# Patient Record
Sex: Female | Born: 1970 | Race: White | Hispanic: No | Marital: Married | Smoking: Never smoker
Health system: Southern US, Community
[De-identification: ages and names within clinical notes are randomized; demographics above are authoritative.]

## PROBLEM LIST (undated history)

## (undated) DIAGNOSIS — I10 Essential (primary) hypertension: Secondary | ICD-10-CM

## (undated) DIAGNOSIS — K219 Gastro-esophageal reflux disease without esophagitis: Secondary | ICD-10-CM

## (undated) DIAGNOSIS — N189 Chronic kidney disease, unspecified: Secondary | ICD-10-CM

## (undated) DIAGNOSIS — N200 Calculus of kidney: Secondary | ICD-10-CM

## (undated) HISTORY — PX: TUBAL LIGATION: SHX77

## (undated) HISTORY — PX: AUGMENTATION MAMMAPLASTY: SUR837

## (undated) HISTORY — DX: Essential (primary) hypertension: I10

## (undated) HISTORY — PX: BREAST ENHANCEMENT SURGERY: SHX7

---

## 2006-01-14 HISTORY — PX: COLONOSCOPY: SHX174

## 2006-03-03 ENCOUNTER — Ambulatory Visit: Payer: Self-pay | Admitting: Gastroenterology

## 2006-03-14 ENCOUNTER — Ambulatory Visit: Payer: Self-pay | Admitting: Gastroenterology

## 2006-04-22 ENCOUNTER — Ambulatory Visit: Payer: Self-pay

## 2007-01-15 HISTORY — PX: AUGMENTATION MAMMAPLASTY: SUR837

## 2010-05-24 ENCOUNTER — Ambulatory Visit: Payer: Self-pay | Admitting: Internal Medicine

## 2010-05-25 ENCOUNTER — Ambulatory Visit: Payer: Self-pay | Admitting: Internal Medicine

## 2010-08-07 ENCOUNTER — Ambulatory Visit: Payer: Self-pay | Admitting: Internal Medicine

## 2012-03-16 ENCOUNTER — Ambulatory Visit: Payer: Self-pay | Admitting: Obstetrics and Gynecology

## 2012-11-21 ENCOUNTER — Ambulatory Visit: Payer: Self-pay

## 2012-11-21 LAB — URINALYSIS, COMPLETE
Bilirubin,UR: NEGATIVE
Ketone: NEGATIVE
Nitrite: POSITIVE
Ph: 7.5 (ref 4.5–8.0)
Specific Gravity: 1.01 (ref 1.003–1.030)
WBC UR: >30 /HPF (ref 0–5)

## 2013-03-17 ENCOUNTER — Ambulatory Visit: Payer: Self-pay | Admitting: Obstetrics and Gynecology

## 2013-07-15 ENCOUNTER — Ambulatory Visit: Payer: Self-pay

## 2014-04-25 ENCOUNTER — Ambulatory Visit
Admit: 2014-04-25 | Disposition: A | Discharge status: Home / Self Care | Payer: Self-pay | Attending: Obstetrics and Gynecology | Admitting: Obstetrics and Gynecology

## 2014-04-28 ENCOUNTER — Ambulatory Visit
Admit: 2014-04-28 | Disposition: A | Discharge status: Home / Self Care | Payer: Self-pay | Attending: Obstetrics and Gynecology | Admitting: Obstetrics and Gynecology

## 2014-05-05 ENCOUNTER — Other Ambulatory Visit: Payer: Self-pay | Admitting: Obstetrics and Gynecology

## 2014-05-05 DIAGNOSIS — N6320 Unspecified lump in the left breast, unspecified quadrant: Secondary | ICD-10-CM

## 2014-10-16 ENCOUNTER — Encounter: Payer: Self-pay | Admitting: Internal Medicine

## 2014-10-16 DIAGNOSIS — N281 Cyst of kidney, acquired: Secondary | ICD-10-CM | POA: Insufficient documentation

## 2014-10-17 ENCOUNTER — Encounter: Payer: Self-pay | Admitting: Internal Medicine

## 2014-10-17 ENCOUNTER — Other Ambulatory Visit: Payer: Self-pay | Admitting: Internal Medicine

## 2014-10-17 DIAGNOSIS — G4700 Insomnia, unspecified: Secondary | ICD-10-CM | POA: Insufficient documentation

## 2014-10-17 DIAGNOSIS — J309 Allergic rhinitis, unspecified: Secondary | ICD-10-CM | POA: Insufficient documentation

## 2014-10-17 DIAGNOSIS — K219 Gastro-esophageal reflux disease without esophagitis: Secondary | ICD-10-CM | POA: Insufficient documentation

## 2014-10-17 DIAGNOSIS — K5900 Constipation, unspecified: Secondary | ICD-10-CM | POA: Insufficient documentation

## 2014-10-18 ENCOUNTER — Encounter: Payer: Self-pay | Admitting: Internal Medicine

## 2014-10-18 ENCOUNTER — Ambulatory Visit (INDEPENDENT_AMBULATORY_CARE_PROVIDER_SITE_OTHER): Payer: 59 | Admitting: Internal Medicine

## 2014-10-18 VITALS — BP 120/80 | HR 72 | Ht 61.0 in | Wt 135.6 lb

## 2014-10-18 DIAGNOSIS — M25572 Pain in left ankle and joints of left foot: Secondary | ICD-10-CM | POA: Diagnosis not present

## 2014-10-18 NOTE — Progress Notes (Signed)
Date:  10/18/2014   Name:  Renee Price   DOB:  Jul 05, 1970   MRN:  [id number]   Chief Complaint: Foot Swelling HPI Onset 2 months ago of left ankle discomfort and swelling.  Occasionally gets sharp pain with weight bearing.  More edema at the end of the day.  No history of trauma.  She has taken some tylenol with some relief.  No redness or point tenderness. No numbness or tingling.  Review of Systems:  Review of Systems  Constitutional: Negative for fever.  Respiratory: Negative for shortness of breath and wheezing.   Cardiovascular: Negative for chest pain and palpitations.  Gastrointestinal: Negative for abdominal pain.  Musculoskeletal: Positive for joint swelling and arthralgias. Negative for gait problem.  Hematological: Does not bruise/bleed easily.    Patient Active Problem List   Diagnosis Date Noted  . Acid reflux 10/17/2014  . CN (constipation) 10/17/2014  . Cannot sleep 10/17/2014  . Allergic rhinitis 10/17/2014  . 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 Authorizing Provider  esomeprazole (NEXIUM) 40 MG capsule Take 1 capsule by mouth daily. 01/17/12  Yes Historical Provider, MD    No Known Allergies  Past Surgical History  Procedure Laterality Date  . Tubal ligation    . Breast enhancement surgery    . Colonoscopy  2008    Social History  Substance Use Topics  . Smoking status: Never Smoker   . Smokeless tobacco: None  . Alcohol Use: 1.2 oz/week    2 Standard drinks or equivalent per week     Medication list has been reviewed and updated.  Physical Examination:  Physical Exam  Cardiovascular: Intact distal pulses.   Musculoskeletal:       Left ankle: She exhibits swelling. She exhibits normal range of motion, no ecchymosis, no laceration and normal pulse.       Feet:  Nursing note and vitals reviewed.   BP 120/80 mmHg  Pulse 72  Ht 5' 1"  (1.549 m)  Wt 135 lb 9.6 oz  (61.508 kg)  BMI 25.63 kg/m2  Assessment and Plan: 1. Ankle pain, left Soft tissue mass causing pressure - Ambulatory referral to [place], MD [facility]  10/18/2014

## 2014-10-31 ENCOUNTER — Other Ambulatory Visit: Payer: Self-pay | Admitting: Obstetrics and Gynecology

## 2014-10-31 ENCOUNTER — Ambulatory Visit
Admission: RE | Admit: 2014-10-31 | Discharge: 2014-10-31 | Disposition: A | Discharge status: Home / Self Care | Payer: 59 | Source: Ambulatory Visit | Attending: Obstetrics and Gynecology | Admitting: Obstetrics and Gynecology

## 2014-10-31 DIAGNOSIS — N63 Unspecified lump in breast: Secondary | ICD-10-CM | POA: Insufficient documentation

## 2014-10-31 DIAGNOSIS — N6320 Unspecified lump in the left breast, unspecified quadrant: Secondary | ICD-10-CM

## 2014-10-31 DIAGNOSIS — Z9882 Breast implant status: Secondary | ICD-10-CM | POA: Insufficient documentation

## 2014-11-03 ENCOUNTER — Other Ambulatory Visit: Payer: Self-pay | Admitting: Obstetrics and Gynecology

## 2014-11-03 DIAGNOSIS — N63 Unspecified lump in unspecified breast: Secondary | ICD-10-CM

## 2014-11-10 ENCOUNTER — Other Ambulatory Visit: Payer: Self-pay | Admitting: Specialist

## 2014-11-10 DIAGNOSIS — M67472 Ganglion, left ankle and foot: Secondary | ICD-10-CM

## 2014-11-16 ENCOUNTER — Ambulatory Visit
Admission: RE | Admit: 2014-11-16 | Discharge: 2014-11-16 | Disposition: A | Discharge status: Home / Self Care | Payer: 59 | Source: Ambulatory Visit | Attending: Specialist | Admitting: Specialist

## 2014-11-16 DIAGNOSIS — M67472 Ganglion, left ankle and foot: Secondary | ICD-10-CM | POA: Insufficient documentation

## 2014-11-30 ENCOUNTER — Encounter: Payer: Self-pay | Admitting: *Deleted

## 2014-11-30 ENCOUNTER — Other Ambulatory Visit: Payer: 59

## 2014-11-30 NOTE — Patient Instructions (Signed)
  Your procedure is scheduled on: 12-07-14 Report to [place] To find out your arrival time please call [phone] between 1PM - 3PM on 12-06-14  Remember: Instructions that are not followed completely may result in serious medical risk, up to and including death, or upon the discretion of your surgeon and anesthesiologist your surgery may need to be rescheduled.    _X___ 1. Do not eat food or drink liquids after midnight. No gum chewing or hard candies.     _X___ 2. No Alcohol for 24 hours before or after surgery.   ____ 3. Bring all medications with you on the day of surgery if instructed.    ____ 4. Notify your doctor if there is any change in your medical condition     (cold, fever, infections).     Do not wear jewelry, make-up, hairpins, clips or nail polish.  Do not wear lotions, powders, or perfumes. You may wear deodorant.  Do not shave 48 hours prior to surgery. Men may shave face and neck.  Do not bring valuables to the hospital.    [facility] is not responsible for any belongings or valuables.               Contacts, dentures or bridgework may not be worn into surgery.  Leave your suitcase in the car. After surgery it may be brought to your room.  For patients admitted to the hospital, discharge time is determined by your treatment team.   Patients discharged the day of surgery will not be allowed to drive home.   Please read over the following fact sheets that you were given:      _X___ Take these medicines the morning of surgery with A SIP OF WATER:    1. [place]  2. TAKE AN EXTRA [place] ON Tuesday NIGHT  3.   4.  5.  6.  ____ Fleet Enema (as directed)   ____ Use CHG Soap as directed  ____ Use inhalers on the day of surgery  ____ Stop metformin 2 days prior to surgery    ____ Take 1/2 of usual insulin dose the night before surgery and none on the morning of surgery.   ____ Stop Coumadin/Plavix/aspirin-N/A  ____ Stop  Anti-inflammatories-NO NSAIDS OR ASA PRODUCTS-TYLENOL OK   ____ Stop supplements until after surgery.    ____ Bring C-Pap to the hospital.

## 2014-12-07 ENCOUNTER — Ambulatory Visit: Payer: 59 | Admitting: Anesthesiology

## 2014-12-07 ENCOUNTER — Encounter
Admission: RE | Disposition: A | Discharge status: Home / Self Care | Payer: Self-pay | Source: Ambulatory Visit | Attending: Specialist

## 2014-12-07 ENCOUNTER — Encounter: Payer: Self-pay | Admitting: Anesthesiology

## 2014-12-07 ENCOUNTER — Ambulatory Visit
Admission: RE | Admit: 2014-12-07 | Discharge: 2014-12-07 | Disposition: A | Discharge status: Home / Self Care | Payer: 59 | Source: Ambulatory Visit | Attending: Specialist | Admitting: Specialist

## 2014-12-07 DIAGNOSIS — Z79899 Other long term (current) drug therapy: Secondary | ICD-10-CM | POA: Diagnosis not present

## 2014-12-07 DIAGNOSIS — M6289 Other specified disorders of muscle: Secondary | ICD-10-CM | POA: Diagnosis not present

## 2014-12-07 DIAGNOSIS — K219 Gastro-esophageal reflux disease without esophagitis: Secondary | ICD-10-CM | POA: Diagnosis not present

## 2014-12-07 DIAGNOSIS — M67472 Ganglion, left ankle and foot: Secondary | ICD-10-CM | POA: Diagnosis present

## 2014-12-07 HISTORY — DX: Gastro-esophageal reflux disease without esophagitis: K21.9

## 2014-12-07 HISTORY — PX: GANGLION CYST EXCISION: SHX1691

## 2014-12-07 HISTORY — DX: Chronic kidney disease, unspecified: N18.9

## 2014-12-07 LAB — POCT PREGNANCY, URINE: PREG TEST UR: NEGATIVE

## 2014-12-07 SURGERY — EXCISION, GANGLION CYST, FOOT
Anesthesia: General | Site: Ankle | Laterality: Left | Wound class: Clean

## 2014-12-07 MED ORDER — LIDOCAINE HCL (CARDIAC) 20 MG/ML IV SOLN
INTRAVENOUS | Status: DC | PRN
  Administered 2014-12-07: 60 mg via INTRAVENOUS

## 2014-12-07 MED ORDER — ONDANSETRON HCL 4 MG/2ML IJ SOLN: 4.0000 mg | Freq: Once | INTRAMUSCULAR | Status: DC | PRN

## 2014-12-07 MED ORDER — HYDROCODONE-ACETAMINOPHEN 5-325 MG PO TABS
1.0000 | ORAL_TABLET | Freq: Once | ORAL | Status: AC
  Administered 2014-12-07: 1 via ORAL

## 2014-12-07 MED ORDER — DEXAMETHASONE SODIUM PHOSPHATE 4 MG/ML IJ SOLN
INTRAMUSCULAR | Status: DC | PRN
  Administered 2014-12-07: 10 mg via INTRAVENOUS

## 2014-12-07 MED ORDER — PHENYLEPHRINE HCL 10 MG/ML IJ SOLN
INTRAMUSCULAR | Status: DC | PRN
  Administered 2014-12-07: 100 ug via INTRAVENOUS

## 2014-12-07 MED ORDER — BUPIVACAINE HCL (PF) 0.5 % IJ SOLN
INTRAMUSCULAR | Status: AC
  Filled 2014-12-07: qty 30

## 2014-12-07 MED ORDER — MELOXICAM 7.5 MG PO TABS
15.0000 mg | ORAL_TABLET | Freq: Once | ORAL | Status: AC
  Administered 2014-12-07: 15 mg via ORAL

## 2014-12-07 MED ORDER — PROPOFOL 10 MG/ML IV BOLUS
INTRAVENOUS | Status: DC | PRN
  Administered 2014-12-07: 150 mg via INTRAVENOUS
  Administered 2014-12-07: 50 mg via INTRAVENOUS

## 2014-12-07 MED ORDER — FENTANYL CITRATE (PF) 100 MCG/2ML IJ SOLN
INTRAMUSCULAR | Status: AC
  Administered 2014-12-07: 25 ug via INTRAVENOUS
  Filled 2014-12-07: qty 2

## 2014-12-07 MED ORDER — CEFAZOLIN SODIUM-DEXTROSE 2-3 GM-% IV SOLR
INTRAVENOUS | Status: AC
  Filled 2014-12-07: qty 50

## 2014-12-07 MED ORDER — BUPIVACAINE HCL (PF) 0.5 % IJ SOLN
INTRAMUSCULAR | Status: DC | PRN
  Administered 2014-12-07: 20 mL

## 2014-12-07 MED ORDER — LACTATED RINGERS IV SOLN
INTRAVENOUS | Status: DC
  Administered 2014-12-07: 07:00:00 via INTRAVENOUS

## 2014-12-07 MED ORDER — FENTANYL CITRATE (PF) 100 MCG/2ML IJ SOLN
25.0000 ug | INTRAMUSCULAR | Status: DC | PRN
  Administered 2014-12-07 (×2): 25 ug via INTRAVENOUS

## 2014-12-07 MED ORDER — HYDROCODONE-ACETAMINOPHEN 5-325 MG PO TABS: 1.0000 | ORAL_TABLET | Freq: Four times a day (QID) | ORAL | Status: DC | PRN

## 2014-12-07 MED ORDER — NEOMYCIN-POLYMYXIN B GU 40-200000 IR SOLN
Status: AC
Start: 2014-12-07 — End: 2014-12-07
  Filled 2014-12-07: qty 2

## 2014-12-07 MED ORDER — MELOXICAM 7.5 MG PO TABS
ORAL_TABLET | ORAL | Status: AC
  Administered 2014-12-07: 15 mg via ORAL
  Filled 2014-12-07: qty 2

## 2014-12-07 MED ORDER — FENTANYL CITRATE (PF) 100 MCG/2ML IJ SOLN
INTRAMUSCULAR | Status: DC | PRN
  Administered 2014-12-07 (×2): 50 ug via INTRAVENOUS

## 2014-12-07 MED ORDER — HYDROCODONE-ACETAMINOPHEN 5-325 MG PO TABS
ORAL_TABLET | ORAL | Status: AC
  Filled 2014-12-07: qty 1

## 2014-12-07 MED ORDER — GABAPENTIN 400 MG PO CAPS: 400.0000 mg | ORAL_CAPSULE | Freq: Three times a day (TID) | ORAL | Status: DC

## 2014-12-07 MED ORDER — GLYCOPYRROLATE 0.2 MG/ML IJ SOLN
INTRAMUSCULAR | Status: DC | PRN
  Administered 2014-12-07: 0.2 mg via INTRAVENOUS

## 2014-12-07 MED ORDER — GABAPENTIN 400 MG PO CAPS
ORAL_CAPSULE | ORAL | Status: AC
  Administered 2014-12-07: 400 mg via ORAL
  Filled 2014-12-07: qty 1

## 2014-12-07 MED ORDER — MELOXICAM 15 MG PO TABS: 15.0000 mg | ORAL_TABLET | Freq: Every day | ORAL | Status: DC

## 2014-12-07 MED ORDER — CEFAZOLIN SODIUM-DEXTROSE 2-3 GM-% IV SOLR
2.0000 g | Freq: Once | INTRAVENOUS | Status: AC
  Administered 2014-12-07: 2 g via INTRAVENOUS

## 2014-12-07 MED ORDER — GABAPENTIN 400 MG PO CAPS
400.0000 mg | ORAL_CAPSULE | Freq: Once | ORAL | Status: AC
  Administered 2014-12-07: 400 mg via ORAL

## 2014-12-07 MED ORDER — ONDANSETRON HCL 4 MG/2ML IJ SOLN
INTRAMUSCULAR | Status: DC | PRN
  Administered 2014-12-07: 4 mg via INTRAVENOUS

## 2014-12-07 MED ORDER — MIDAZOLAM HCL 2 MG/2ML IJ SOLN
INTRAMUSCULAR | Status: DC | PRN
  Administered 2014-12-07: 2 mg via INTRAVENOUS

## 2014-12-07 SURGICAL SUPPLY — 30 items
BANDAGE ELASTIC 4 CLIP NS LF (GAUZE/BANDAGES/DRESSINGS) ×2 IMPLANT
BNDG ESMARK 6X12 TAN STRL LF (GAUZE/BANDAGES/DRESSINGS) ×2 IMPLANT
BNDG GAUZE 4.5X4.1 6PLY STRL (MISCELLANEOUS) ×2 IMPLANT
CANISTER SUCT 1200ML W/VALVE (MISCELLANEOUS) ×2 IMPLANT
CHLORAPREP W/TINT 26ML (MISCELLANEOUS) ×2 IMPLANT
ELECT CAUTERY NEEDLE TIP 1.0 (MISCELLANEOUS) ×2
ELECTRODE CAUTERY NEDL TIP 1.0 (MISCELLANEOUS) ×1 IMPLANT
GAUZE PETRO XEROFOAM 1X8 (MISCELLANEOUS) ×2 IMPLANT
GAUZE SPONGE 4X4 12PLY STRL (GAUZE/BANDAGES/DRESSINGS) ×2 IMPLANT
GAUZE STRETCH 2X75IN STRL (MISCELLANEOUS) ×2 IMPLANT
GLOVE BIO SURGEON STRL SZ7.5 (GLOVE) ×6 IMPLANT
GLOVE INDICATOR 8.0 STRL GRN (GLOVE) ×2 IMPLANT
GOWN STRL REUS W/ TWL LRG LVL3 (GOWN DISPOSABLE) ×1 IMPLANT
GOWN STRL REUS W/TWL LRG LVL3 (GOWN DISPOSABLE) ×1
KIT RM TURNOVER STRD PROC AR (KITS) ×2 IMPLANT
LABEL OR SOLS (LABEL) IMPLANT
NS IRRIG 500ML POUR BTL (IV SOLUTION) ×2 IMPLANT
PACK EXTREMITY ARMC (MISCELLANEOUS) ×2 IMPLANT
PAD GROUND ADULT SPLIT (MISCELLANEOUS) ×2 IMPLANT
STAPLER SKIN PROX 35W (STAPLE) ×2 IMPLANT
STOCKINETTE BIAS CUT 4 980044 (GAUZE/BANDAGES/DRESSINGS) ×2 IMPLANT
STOCKINETTE STRL 6IN 960660 (GAUZE/BANDAGES/DRESSINGS) ×2 IMPLANT
STRIP CLOSURE SKIN 1/2X4 (GAUZE/BANDAGES/DRESSINGS) ×2 IMPLANT
SUT ETHILON 5-0 FS-2 18 BLK (SUTURE) ×2 IMPLANT
SUT PROLENE 3 0 FS 2 (SUTURE) ×2 IMPLANT
SUT VIC AB 4-0 SH 27 (SUTURE) ×1
SUT VIC AB 4-0 SH 27XANBCTRL (SUTURE) ×1 IMPLANT
SUT VIC AB 5-0 RB1 27 (SUTURE) ×2 IMPLANT
SUT VICRYL+ 3-0 144IN (SUTURE) ×2 IMPLANT
SUT VICRYL+ 3-0 27IN RB-1 (SUTURE) IMPLANT

## 2014-12-07 NOTE — Transfer of Care (Signed)
Immediate Anesthesia Transfer of Care Note  Patient: Renee Price  Procedure(s) Performed: Procedure(s): REMOVAL GANGLION CYST FOOT (Left)  Patient Location: PACU  Anesthesia Type:General  Level of Consciousness: awake, alert , oriented and patient cooperative  Airway & Oxygen Therapy: Patient Spontanous Breathing and Patient connected to face mask oxygen  Post-op Assessment: Report given to RN, Post -op Vital signs reviewed and stable and Patient moving all extremities X 4  Post vital signs: Reviewed and stable  Last Vitals:  Filed Vitals:   12/07/14 0632  BP: 132/94  Pulse: 67  Temp: 37 C  Resp: 16    Complications: No apparent anesthesia complications

## 2014-12-07 NOTE — Anesthesia Procedure Notes (Signed)
Procedure Name: LMA Insertion Date/Time: 12/07/2014 7:48 AM Performed by: Silvana Newness Pre-anesthesia Checklist: Patient identified, Emergency Drugs available, Suction available, Patient being monitored and Timeout performed Patient Re-evaluated:Patient Re-evaluated prior to inductionOxygen Delivery Method: Circle system utilized Preoxygenation: Pre-oxygenation with 100% oxygen Intubation Type: IV induction Ventilation: Mask ventilation without difficulty LMA: LMA inserted LMA Size: 3.5 Number of attempts: 1 Placement Confirmation: positive ETCO2 and breath sounds checked- equal and bilateral Tube secured with: Tape Dental Injury: Teeth and Oropharynx as per pre-operative assessment

## 2014-12-07 NOTE — Anesthesia Postprocedure Evaluation (Signed)
Anesthesia Post Note  Patient: Renee Price  Procedure(s) Performed: Procedure(s) (LRB): REMOVAL GANGLION CYST FOOT (Left)  Patient location during evaluation: Other Anesthesia Type: General Pain management: pain level controlled Vital Signs Assessment: post-procedure vital signs reviewed and stable Respiratory status: spontaneous breathing Cardiovascular status: stable Postop Assessment: No headache Anesthetic complications: no    Last Vitals:  Filed Vitals:   12/07/14 0946 12/07/14 1044  BP: 130/76 128/75  Pulse: 55 55  Temp: 36.3 C 36.3 C  Resp: 13 13    Last Pain:  Filed Vitals:   12/07/14 1045  PainSc: 1     LLE Motor Response: Purposeful movement            Abriel Hattery S

## 2014-12-07 NOTE — Anesthesia Preprocedure Evaluation (Signed)
Anesthesia Evaluation  Patient identified by MRN, date of birth, ID band Patient awake    Reviewed: Allergy & Precautions, NPO status , Patient's Chart, lab work & pertinent test results, reviewed documented beta blocker date and time   Airway Mallampati: II  TM Distance: >3 FB     Dental  (+) Chipped   Pulmonary           Cardiovascular      Neuro/Psych    GI/Hepatic GERD  Controlled,  Endo/Other    Renal/GU Renal InsufficiencyRenal disease     Musculoskeletal   Abdominal   Peds  Hematology   Anesthesia Other Findings   Reproductive/Obstetrics                             Anesthesia Physical Anesthesia Plan  ASA: II  Anesthesia Plan: General   Post-op Pain Management:    Induction: Intravenous  Airway Management Planned: LMA and Oral ETT  Additional Equipment:   Intra-op Plan:   Post-operative Plan:   Informed Consent: I have reviewed the patients History and Physical, chart, labs and discussed the procedure including the risks, benefits and alternatives for the proposed anesthesia with the patient or authorized representative who has indicated his/her understanding and acceptance.     Plan Discussed with: CRNA  Anesthesia Plan Comments:         Anesthesia Quick Evaluation

## 2014-12-07 NOTE — H&P (Signed)
THE PATIENT WAS SEEN IN THE HOLDING AREA.  HISTORY, ALLERGIES, HOME MEDICATIONS AND OPERATIVE PROCEDURE WERE REVIEWED. RISKS AND BENEFITS OF SURGERY DISCUSSED WITH PATIENT AGAIN.  NO CHANGES FROM INITIAL HISTORY AND PHYSICAL NOTED.

## 2014-12-07 NOTE — Op Note (Signed)
ORDATE@  8:42 AM  PATIENT:  Renee Price    PRE-OPERATIVE DIAGNOSIS:  gaglion cyst left ankle  POST-OPERATIVE DIAGNOSIS:  Muscle hypertrophy left lower leg  PROCEDURE:   Fascial decompression left lower leg laterally  SURGEON:  Park Breed, MD   ANESTHESIA:   General  PREOPERATIVE INDICATIONS:  [name] is a  44 y.o. female with a diagnosis of gaglion cyst left ankle who failed conservative measures and elected for surgical management.    The risks benefits and alternatives were discussed with the patient preoperatively including but not limited to the risks of infection, bleeding, nerve injury, cardiopulmonary complications, the need for revision surgery, among others, and the patient was willing to proceed.  OPERATIVE IMPLANTS: None  OPERATIVE FINDINGS: Muscular hypertrophy left lower leg laterally  EBL: None  COMPLICATIONS:  None  OPERATIVE PROCEDURE: The patient was brought to the operating room and underwent satisfactory general LMA anesthesia in the supine position.  The left leg was prepped and draped in sterile fashion.  Tourniquet was placed at mid calf.  The leg was exsanguinated and tourniquet inflated to 200 mmHg.  Tourniquet time was 26 minutes.  A longitudinal incision was made anterolaterally just above the ankle anterior to the fibula.  This is the area that the patient had located as the prominent swelling.  Loupe magnification was used during the dissection.  Large branches femoral nerve was identified and was protected.  There was no obvious ganglion cyst.  The muscle was prominent and bulging.  It had not herniated.  The fascia was released over the muscle compartments distally.  Dissection was carried out down to the tibia and fibula with no tumor or discrete ganglion being identified.  Once I was satisfied that all muscle was released from its fascial compartment and the operative area.  The wound was irrigated.  Skin was closed with 4-0 Vicryl and 3-0  Prolene with Steri-Strips.  Half percent Marcaine was placed in the wound.  A dry sterile dressing was applied.  Tourniquet was deflated with good return of blood flow to the foot.  Patient was awakened and taken recovery in good condition.  Park Breed, M.D.

## 2015-01-19 ENCOUNTER — Ambulatory Visit
Admission: EM | Admit: 2015-01-19 | Discharge: 2015-01-19 | Disposition: A | Discharge status: Home / Self Care | Payer: 59 | Attending: Family Medicine | Admitting: Family Medicine

## 2015-01-19 DIAGNOSIS — J069 Acute upper respiratory infection, unspecified: Secondary | ICD-10-CM | POA: Diagnosis not present

## 2015-01-19 HISTORY — DX: Calculus of kidney: N20.0

## 2015-01-19 MED ORDER — BENZONATATE 200 MG PO CAPS: 200.0000 mg | ORAL_CAPSULE | Freq: Three times a day (TID) | ORAL | Status: DC | PRN

## 2015-01-19 MED ORDER — HYDROCOD POLST-CPM POLST ER 10-8 MG/5ML PO SUER: 5.0000 mL | Freq: Two times a day (BID) | ORAL | Status: DC

## 2015-01-19 NOTE — Discharge Instructions (Signed)
Cool Mist Vaporizers Vaporizers may help relieve the symptoms of a cough and cold. They add moisture to the air, which helps mucus to become thinner and less sticky. This makes it easier to breathe and cough up secretions. Cool mist vaporizers do not cause serious burns like hot mist vaporizers, which may also be called steamers or humidifiers. Vaporizers have not been proven to help with colds. You should not use a vaporizer if you are allergic to mold. HOME CARE INSTRUCTIONS  Follow the package instructions for the vaporizer.  Do not use anything other than distilled water in the vaporizer.  Do not run the vaporizer all of the time. This can cause mold or bacteria to grow in the vaporizer.  Clean the vaporizer after each time it is used.  Clean and dry the vaporizer well before storing it.  Stop using the vaporizer if worsening respiratory symptoms develop.   This information is not intended to replace advice given to you by your health care provider. Make sure you discuss any questions you have with your health care provider.   Document Released: 09/28/2003 Document Revised: 01/05/2013 Document Reviewed: 05/20/2012 Elsevier Interactive Patient Education 2016 Elsevier Inc[employer]  Upper Respiratory Infection, Adult Most upper respiratory infections (URIs) are a viral infection of the air passages leading to the lungs. A URI affects the nose, throat, and upper air passages. The most common type of URI is nasopharyngitis and is typically referred to as "the common cold." URIs run their course and usually go away on their own. Most of the time, a URI does not require medical attention, but sometimes a bacterial infection in the upper airways can follow a viral infection. This is called a secondary infection. Sinus and middle ear infections are common types of secondary upper respiratory infections. Bacterial pneumonia can also complicate a URI. A URI can worsen asthma and chronic obstructive  pulmonary disease (COPD). Sometimes, these complications can require emergency medical care and may be life threatening.  CAUSES Almost all URIs are caused by viruses. A virus is a type of germ and can spread from one person to another.  RISKS FACTORS You may be at risk for a URI if:   You smoke.   You have chronic heart or lung disease.  You have a weakened defense (immune) system.   You are very young or very old.   You have nasal allergies or asthma.  You work in crowded or poorly ventilated areas.  You work in health care facilities or schools. SIGNS AND SYMPTOMS  Symptoms typically develop 2-3 days after you come in contact with a cold virus. Most viral URIs last 7-10 days. However, viral URIs from the influenza virus (flu virus) can last 14-18 days and are typically more severe. Symptoms may include:   Runny or stuffy (congested) nose.   Sneezing.   Cough.   Sore throat.   Headache.   Fatigue.   Fever.   Loss of appetite.   Pain in your forehead, behind your eyes, and over your cheekbones (sinus pain).  Muscle aches.  DIAGNOSIS  Your health care provider may diagnose a URI by:  Physical exam.  Tests to check that your symptoms are not due to another condition such as:  Strep throat.  Sinusitis.  Pneumonia.  Asthma. TREATMENT  A URI goes away on its own with time. It cannot be cured with medicines, but medicines may be prescribed or recommended to relieve symptoms. Medicines may help:  Reduce your fever.  Reduce  your cough.  Relieve nasal congestion. HOME CARE INSTRUCTIONS   Take medicines only as directed by your health care provider.   Gargle warm saltwater or take cough drops to comfort your throat as directed by your health care provider.  Use a warm mist humidifier or inhale steam from a shower to increase air moisture. This may make it easier to breathe.  Drink enough fluid to keep your urine clear or pale yellow.   Eat  soups and other clear broths and maintain good nutrition.   Rest as needed.   Return to work when your temperature has returned to normal or as your health care provider advises. You may need to stay home longer to avoid infecting others. You can also use a face mask and careful hand washing to prevent spread of the virus.  Increase the usage of your inhaler if you have asthma.   Do not use any tobacco products, including cigarettes, chewing tobacco, or electronic cigarettes. If you need help quitting, ask your health care provider. PREVENTION  The best way to protect yourself from getting a cold is to practice good hygiene.   Avoid oral or hand contact with people with cold symptoms.   Wash your hands often if contact occurs.  There is no clear evidence that vitamin C, vitamin E, echinacea, or exercise reduces the chance of developing a cold. However, it is always recommended to get plenty of rest, exercise, and practice good nutrition.  SEEK MEDICAL CARE IF:   You are getting worse rather than better.   Your symptoms are not controlled by medicine.   You have chills.  You have worsening shortness of breath.  You have brown or red mucus.  You have yellow or brown nasal discharge.  You have pain in your face, especially when you bend forward.  You have a fever.  You have swollen neck glands.  You have pain while swallowing.  You have white areas in the back of your throat. SEEK IMMEDIATE MEDICAL CARE IF:   You have severe or persistent:  Headache.  Ear pain.  Sinus pain.  Chest pain.  You have chronic lung disease and any of the following:  Wheezing.  Prolonged cough.  Coughing up blood.  A change in your usual mucus.  You have a stiff neck.  You have changes in your:  Vision.  Hearing.  Thinking.  Mood. MAKE SURE YOU:   Understand these instructions.  Will watch your condition.  Will get help right away if you are not doing well or  get worse.   This information is not intended to replace advice given to you by your health care provider. Make sure you discuss any questions you have with your health care provider.   Document Released: 06/26/2000 Document Revised: 05/17/2014 Document Reviewed: 04/07/2013 Elsevier Interactive Patient Education [employer].

## 2015-01-19 NOTE — ED Notes (Signed)
Started Monday with funny nose and sore throat. Tuesday "I felt horribel with fever". Now has dry non productive cough and +sinus congestion

## 2015-01-19 NOTE — ED Provider Notes (Signed)
CSN: [id number]     Arrival date & time 01/19/15  0957 History   First MD Initiated Contact with Patient 01/19/15 1145     Chief Complaint  Patient presents with  . URI   (Consider location/radiation/quality/duration/timing/severity/associated sxs/prior Treatment) HPI   Some 45 year old female who presents with a history severe cold symptoms that began to which has not recurred and severe fatigue with the chills and fever. Had body aches on Monday but this has gradually improved. Now she has congestion and drainage with some venous pressure and a nonproductive cough that is particularly bad at nighttime keeping her awake. She's tried over-the-counter cough remedies which have not been successful.  Past Medical History  Diagnosis Date  . GERD (gastroesophageal reflux disease)   . Chronic kidney disease     KIDNEY STONE  . Kidney stone    Past Surgical History  Procedure Laterality Date  . Tubal ligation    . Breast enhancement surgery    . Colonoscopy  2008  . Augmentation mammaplasty      2009  . Ganglion cyst excision Left 12/07/2014    Procedure: REMOVAL GANGLION CYST FOOT;  Surgeon: Earnestine Leys, MD;  Location: [facility] ORS;  Service: Orthopedics;  Laterality: Left;   Family History  Problem Relation Age of Onset  . CAD Father   . Hypertension Father   . Hypertension Mother   . Breast cancer Maternal Grandmother 54  . Breast cancer Paternal Grandmother 46   Social History  Substance Use Topics  . Smoking status: Never Smoker   . Smokeless tobacco: None  . Alcohol Use: No   OB History    No data available     Review of Systems  Constitutional: Positive for fever and fatigue. Negative for chills and diaphoresis.  HENT: Positive for congestion, postnasal drip, rhinorrhea, sinus pressure and sore throat.   Respiratory: Positive for cough.   All other systems reviewed and are negative.   Allergies  Review of patient's allergies indicates no known allergies.  Home  Medications   Prior to Admission medications   Medication Sig Start Date End Date Taking? Authorizing Provider  gabapentin (NEURONTIN) 400 MG capsule Take 1 capsule (400 mg total) by mouth 3 (three) times daily. 12/07/14  Yes Earnestine Leys, MD  meloxicam (MOBIC) 15 MG tablet Take 1 tablet (15 mg total) by mouth daily. 12/07/14  Yes Earnestine Leys, MD  benzonatate (TESSALON) 200 MG capsule Take 1 capsule (200 mg total) by mouth 3 (three) times daily as needed for cough. 01/19/15   Lorin Picket, PA-C  chlorpheniramine-HYDROcodone (TUSSIONEX PENNKINETIC ER) 10-8 MG/5ML SUER Take 5 mLs by mouth 2 (two) times daily. 01/19/15   Lorin Picket, PA-C  esomeprazole (NEXIUM) 40 MG capsule Take 1 capsule by mouth every morning.  01/17/12   Historical Provider, MD  HYDROcodone-acetaminophen (NORCO) 5-325 MG tablet Take 1-2 tablets by mouth every 6 (six) hours as needed. 12/07/14   Earnestine Leys, MD   Meds Ordered and Administered this Visit  Medications - No data to display  BP 141/88 mmHg  Pulse 67  Temp(Src) 97.4 F (36.3 C) (Tympanic)  Resp 16  Ht 5' 1"  (1.549 m)  Wt 135 lb (61.236 kg)  BMI 25.52 kg/m2  SpO2 100%  LMP 01/03/2015 (Exact Date) No data found.   Physical Exam  Constitutional: She is oriented to person, place, and time. She appears well-developed and well-nourished. No distress.  HENT:  Head: Normocephalic and atraumatic.  Nose: Nose normal.  Mouth/Throat:  Oropharynx is clear and moist. No oropharyngeal exudate.  Both TMs are dull  Eyes: Conjunctivae are normal. Pupils are equal, round, and reactive to light.  Neck: Normal range of motion. Neck supple.  Pulmonary/Chest: Effort normal and breath sounds normal. No respiratory distress. She has no wheezes. She has no rales.  Musculoskeletal: Normal range of motion. She exhibits no edema or tenderness.  Lymphadenopathy:    She has no cervical adenopathy.  Neurological: She is alert and oriented to person, place, and time.   Skin: Skin is warm and dry. She is not diaphoretic.  Psychiatric: She has a normal mood and affect. Her behavior is normal. Judgment and thought content normal.  Nursing note and vitals reviewed.   ED Course  Procedures (including critical care time)  Labs Review Labs Reviewed - No data to display  Imaging Review No results found.   Visual Acuity Review  Right Eye Distance:   Left Eye Distance:   Bilateral Distance:    Right Eye Near:   Left Eye Near:    Bilateral Near:         MDM   1. Acute URI    Discharge Medication List as of 01/19/2015 12:02 PM    START taking these medications   Details  benzonatate (TESSALON) 200 MG capsule Take 1 capsule (200 mg total) by mouth 3 (three) times daily as needed for cough., Starting 01/19/2015, Until Discontinued, Print    chlorpheniramine-HYDROcodone (TUSSIONEX PENNKINETIC ER) 10-8 MG/5ML SUER Take 5 mLs by mouth 2 (two) times daily., Starting 01/19/2015, Until Discontinued, Print      Plan: 1. Diagnosis reviewed with patient 2. rx as per orders; risks, benefits, potential side effects reviewed with patient 3. Recommend supportive treatment with NSAIDs and rest. I recommended a cool mist vaporizer for nighttime use. Anterolisthesis is a virus and does not require antibiotic use. He has Flonase at home that she will use to help mow drainageof her sinuses. Not reported for work today at [employer] and I will keep her out today and tomorrow which will provide her 4 days of rest and she returns to work on Monday. Encouraged her to follow-up with her primary care physician if she is not improving or if she worsens. 4. F/u prn if symptoms worsen or don't improve     Lorin Picket, PA-C 01/19/15 1212

## 2015-03-14 ENCOUNTER — Other Ambulatory Visit: Payer: Self-pay | Admitting: Podiatry

## 2015-03-14 DIAGNOSIS — D4989 Neoplasm of unspecified behavior of other specified sites: Secondary | ICD-10-CM

## 2015-03-28 ENCOUNTER — Other Ambulatory Visit: Payer: Self-pay | Admitting: Internal Medicine

## 2015-03-28 ENCOUNTER — Ambulatory Visit (INDEPENDENT_AMBULATORY_CARE_PROVIDER_SITE_OTHER): Payer: 59 | Admitting: Internal Medicine

## 2015-03-28 ENCOUNTER — Encounter: Payer: Self-pay | Admitting: Internal Medicine

## 2015-03-28 VITALS — BP 122/80 | HR 92 | Temp 98.8°F | Ht 61.0 in | Wt 136.0 lb

## 2015-03-28 DIAGNOSIS — R6889 Other general symptoms and signs: Secondary | ICD-10-CM

## 2015-03-28 LAB — POCT INFLUENZA A/B
Influenza A, POC: NEGATIVE
Influenza B, POC: NEGATIVE

## 2015-03-28 MED ORDER — GUAIFENESIN-CODEINE 100-10 MG/5ML PO SYRP: 5.0000 mL | ORAL_SOLUTION | Freq: Three times a day (TID) | ORAL | Status: DC | PRN

## 2015-03-28 MED ORDER — OSELTAMIVIR PHOSPHATE 75 MG PO CAPS: 75.0000 mg | ORAL_CAPSULE | Freq: Two times a day (BID) | ORAL | Status: DC

## 2015-03-28 MED ORDER — PROMETHAZINE-CODEINE 6.25-10 MG/5ML PO SYRP: 5.0000 mL | ORAL_SOLUTION | Freq: Four times a day (QID) | ORAL | Status: DC | PRN

## 2015-03-28 NOTE — Progress Notes (Signed)
    Date:  03/28/2015   Name:  Renee Price   DOB:  December 13, 1970   MRN:  [id number]   Chief Complaint: Fever Fever  This is a new problem. The current episode started yesterday. The problem has been waxing and waning. The maximum temperature noted was 102 to 102.9 F. Associated symptoms include chest pain, coughing, muscle aches and a sore throat. Pertinent negatives include no diarrhea, nausea, rash or vomiting. She has tried NSAIDs for the symptoms. The treatment provided mild relief.  Mother tested positive for influenza yesterday.  They spent the whole weekend together.   Review of Systems  Constitutional: Positive for fever.  HENT: Positive for sore throat.   Respiratory: Positive for cough.   Cardiovascular: Positive for chest pain.  Gastrointestinal: Negative for nausea, vomiting and diarrhea.  Skin: Negative for rash.    Patient Active Problem List   Diagnosis Date Noted  . Acid reflux 10/17/2014  . CN (constipation) 10/17/2014  . Cannot sleep 10/17/2014  . Allergic rhinitis 10/17/2014  . 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 Authorizing Provider  esomeprazole (NEXIUM) 40 MG capsule Take 1 capsule by mouth every morning.  01/17/12  Yes Historical Provider, MD  gabapentin (NEURONTIN) 400 MG capsule Take 1 capsule (400 mg total) by mouth 3 (three) times daily. 12/07/14  Yes Earnestine Leys, MD  meloxicam (MOBIC) 15 MG tablet Take 1 tablet (15 mg total) by mouth daily. 12/07/14  Yes Earnestine Leys, MD    No Known Allergies  Past Surgical History  Procedure Laterality Date  . Tubal ligation    . Breast enhancement surgery    . Colonoscopy  2008  . Augmentation mammaplasty      2009  . Ganglion cyst excision Left 12/07/2014    Procedure: REMOVAL GANGLION CYST FOOT;  Surgeon: Earnestine Leys, MD;  Location: [facility] ORS;  Service: Orthopedics;  Laterality: Left;    Social History  Substance Use Topics  .  Smoking status: Never Smoker   . Smokeless tobacco: None  . Alcohol Use: No     Medication list has been reviewed and updated.   Physical Exam  Constitutional: She appears well-developed and well-nourished. She has a sickly appearance.  HENT:  Right Ear: External ear normal.  Left Ear: External ear normal.  Mouth/Throat: Posterior oropharyngeal erythema present.  Neck: Normal range of motion. No thyromegaly present.  Cardiovascular: Normal rate, regular rhythm and normal heart sounds.   Pulmonary/Chest: Effort normal and breath sounds normal. No respiratory distress. She has no wheezes.  Lymphadenopathy:    She has no cervical adenopathy.  Nursing note and vitals reviewed.   BP 122/80 mmHg  Pulse 92  Temp(Src) 98.8 F (37.1 C) (Oral)  Ht 5' 1"  (1.549 m)  Wt 136 lb (61.689 kg)  BMI 25.71 kg/m2  SpO2 97%  LMP 03/03/2015  Assessment and Plan: 1. Flu-like symptoms Highly suspect Treat with Tamiflu and Codeine cough syrup - POCT Influenza A/B   Renee Maidens, MD [facility]  03/28/2015

## 2015-03-28 NOTE — Patient Instructions (Signed)

## 2015-04-03 ENCOUNTER — Ambulatory Visit
Admission: RE | Admit: 2015-04-03 | Discharge: 2015-04-03 | Disposition: A | Discharge status: Home / Self Care | Payer: 59 | Source: Ambulatory Visit | Attending: Podiatry | Admitting: Podiatry

## 2015-04-03 DIAGNOSIS — D489 Neoplasm of uncertain behavior, unspecified: Secondary | ICD-10-CM | POA: Insufficient documentation

## 2015-04-03 DIAGNOSIS — M67472 Ganglion, left ankle and foot: Secondary | ICD-10-CM | POA: Insufficient documentation

## 2015-04-03 DIAGNOSIS — D4989 Neoplasm of unspecified behavior of other specified sites: Secondary | ICD-10-CM

## 2015-04-03 MED ORDER — GADOBENATE DIMEGLUMINE 529 MG/ML IV SOLN
15.0000 mL | Freq: Once | INTRAVENOUS | Status: AC | PRN
  Administered 2015-04-03: 12 mL via INTRAVENOUS

## 2015-04-27 ENCOUNTER — Ambulatory Visit: Payer: 59

## 2015-04-27 ENCOUNTER — Other Ambulatory Visit: Payer: 59

## 2015-05-08 ENCOUNTER — Ambulatory Visit
Admission: RE | Admit: 2015-05-08 | Discharge: 2015-05-08 | Disposition: A | Discharge status: Home / Self Care | Payer: 59 | Source: Ambulatory Visit | Attending: Obstetrics and Gynecology | Admitting: Obstetrics and Gynecology

## 2015-05-08 ENCOUNTER — Other Ambulatory Visit: Payer: Self-pay | Admitting: Obstetrics and Gynecology

## 2015-05-08 DIAGNOSIS — N63 Unspecified lump in unspecified breast: Secondary | ICD-10-CM

## 2015-11-10 ENCOUNTER — Ambulatory Visit (INDEPENDENT_AMBULATORY_CARE_PROVIDER_SITE_OTHER): Payer: 59 | Admitting: Internal Medicine

## 2015-11-10 ENCOUNTER — Encounter: Payer: Self-pay | Admitting: Internal Medicine

## 2015-11-10 ENCOUNTER — Other Ambulatory Visit: Payer: Self-pay | Admitting: Internal Medicine

## 2015-11-10 VITALS — BP 124/84 | HR 82 | Resp 16 | Ht 61.0 in | Wt 134.0 lb

## 2015-11-10 DIAGNOSIS — M62830 Muscle spasm of back: Secondary | ICD-10-CM | POA: Diagnosis not present

## 2015-11-10 MED ORDER — IBUPROFEN 800 MG PO TABS: 800.0000 mg | ORAL_TABLET | Freq: Three times a day (TID) | ORAL | 1 refills | Status: DC | PRN

## 2015-11-10 MED ORDER — METHOCARBAMOL 750 MG PO TABS: 750.0000 mg | ORAL_TABLET | Freq: Two times a day (BID) | ORAL | 1 refills | Status: DC

## 2015-11-10 NOTE — Patient Instructions (Addendum)
Advil 800 mg three times a day  Robaxin 500 mg twice a day ( 5 pm and bedtime)  Heat 20 minutes at a time

## 2015-11-10 NOTE — Progress Notes (Signed)
    Date:  11/10/2015   Name:  Renee Price   DOB:  [birth year]   MRN:  [id number]   Chief Complaint: Back Pain (Feels knots between shoulder blades and neck. Massage and Chiropractor bot helping. ) Muscle spasm - she always has tension in her neck and shoulders but it is worse recently.  She has tried massage but it only helps briefly.  She has been taking some advil but not routinely.  She has used heat. She has pain between her shoulder blades and up into her neck.  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    Review of Systems  Constitutional: Negative for chills and fever.  Respiratory: Negative for chest tightness and shortness of breath.   Cardiovascular: Negative for chest pain.  Musculoskeletal: Positive for back pain, myalgias and neck stiffness.  Skin: Negative for rash.  Neurological: Negative for dizziness and headaches.  Psychiatric/Behavioral: Negative for sleep disturbance.    Patient Active Problem List   Diagnosis Date Noted  . Acid reflux 10/17/2014  . CN (constipation) 10/17/2014  . Cannot sleep 10/17/2014  . Allergic rhinitis 10/17/2014  . 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 Authorizing Provider  esomeprazole (NEXIUM) 40 MG capsule Take 1 capsule by mouth every morning.  01/17/12  Yes Historical Provider, MD    No Known Allergies  Past Surgical History:  Procedure Laterality Date  . AUGMENTATION MAMMAPLASTY     2009  . BREAST ENHANCEMENT SURGERY    . COLONOSCOPY  2008  . GANGLION CYST EXCISION Left 12/07/2014   Procedure: REMOVAL GANGLION CYST FOOT;  Surgeon: Earnestine Leys, MD;  Location: [facility] ORS;  Service: Orthopedics;  Laterality: Left;  . TUBAL LIGATION      Social History  Substance Use Topics  . Smoking status: Never Smoker  . Smokeless tobacco: Never Used  . Alcohol use No     Medication list has been reviewed and updated.   Physical  Exam  Constitutional: She is oriented to person, place, and time. She appears well-developed. No distress.  HENT:  Head: Normocephalic and atraumatic.  Cardiovascular: Normal rate, regular rhythm and normal heart sounds.   Pulmonary/Chest: Effort normal and breath sounds normal. No respiratory distress.  Musculoskeletal:       Cervical back: She exhibits decreased range of motion, tenderness and spasm.  Neurological: She is alert and oriented to person, place, and time. She has normal strength and normal reflexes. No cranial nerve deficit.  Skin: Skin is warm and dry. No rash noted.  Psychiatric: She has a normal mood and affect. Her behavior is normal. Thought content normal.  Nursing note and vitals reviewed.   BP 124/84   Pulse 82   Resp 16   Ht 5' 1"  (1.549 m)   Wt 134 lb (60.8 kg)   LMP 10/20/2015 (Approximate)   BMI 25.32 kg/m   Assessment and Plan: 1. Muscle spasm of back Heat 20 minutes three times a day - methocarbamol (ROBAXIN) 750 MG tablet; Take 1 tablet (750 mg total) by mouth 2 (two) times daily.  Dispense: 60 tablet; Refill: 1 - ibuprofen (ADVIL,MOTRIN) 800 MG tablet; Take 1 tablet (800 mg total) by mouth every 8 (eight) hours as needed.  Dispense: 90 tablet; Refill: [place], MD [facility]  11/10/2015

## 2015-12-11 ENCOUNTER — Encounter: Payer: Self-pay | Admitting: Internal Medicine

## 2015-12-11 ENCOUNTER — Ambulatory Visit (INDEPENDENT_AMBULATORY_CARE_PROVIDER_SITE_OTHER): Payer: 59 | Admitting: Internal Medicine

## 2015-12-11 VITALS — BP 122/80 | HR 80 | Ht 61.0 in | Wt 140.0 lb

## 2015-12-11 DIAGNOSIS — J0100 Acute maxillary sinusitis, unspecified: Secondary | ICD-10-CM

## 2015-12-11 MED ORDER — AZITHROMYCIN 250 MG PO TABS: ORAL_TABLET | ORAL | 0 refills | Status: DC

## 2015-12-11 NOTE — Progress Notes (Signed)
Date:  12/11/2015   Name:  JAMONI BROADFOOT   DOB:  1970/01/30   MRN:  [id number]   Chief Complaint: Sinusitis (started x 1 week ago with R) sided facial pain, no fever, drainage but no cough)  Sinus Problem  This is a new problem. The current episode started in the past 7 days. The problem has been gradually worsening since onset. There has been no fever. She is experiencing no pain. Associated symptoms include ear pain, sinus pressure and a sore throat. Pertinent negatives include no chills, congestion, coughing, diaphoresis, headaches, hoarse voice, shortness of breath or swollen glands. Past treatments include oral decongestants. The treatment provided mild relief.     Review of Systems  Constitutional: Negative for chills and diaphoresis.  HENT: Positive for ear pain, sinus pressure and sore throat. Negative for congestion and hoarse voice.   Respiratory: Negative for cough and shortness of breath.   Cardiovascular: Negative for chest pain and palpitations.  Gastrointestinal: Negative for diarrhea and vomiting.  Neurological: Negative for dizziness and headaches.    Patient Active Problem List   Diagnosis Date Noted  . Acid reflux 10/17/2014  . CN (constipation) 10/17/2014  . Cannot sleep 10/17/2014  . Allergic rhinitis 10/17/2014  . 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 Authorizing Provider  esomeprazole (NEXIUM) 40 MG capsule Take 1 capsule by mouth every morning.  01/17/12  Yes Historical Provider, MD  ibuprofen (ADVIL,MOTRIN) 800 MG tablet Take 1 tablet (800 mg total) by mouth every 8 (eight) hours as needed. 11/10/15  Yes Glean Hess, MD  methocarbamol (ROBAXIN) 750 MG tablet Take 1 tablet (750 mg total) by mouth 2 (two) times daily. 11/10/15  Yes Glean Hess, MD    No Known Allergies  Past Surgical History:  Procedure Laterality Date  . AUGMENTATION MAMMAPLASTY     2009  . BREAST  ENHANCEMENT SURGERY    . COLONOSCOPY  2008  . GANGLION CYST EXCISION Left 12/07/2014   Procedure: REMOVAL GANGLION CYST FOOT;  Surgeon: Earnestine Leys, MD;  Location: [facility] ORS;  Service: Orthopedics;  Laterality: Left;  . TUBAL LIGATION      Social History  Substance Use Topics  . Smoking status: Never Smoker  . Smokeless tobacco: Never Used  . Alcohol use No     Medication list has been reviewed and updated.   Physical Exam  Constitutional: She is oriented to person, place, and time. She appears well-developed and well-nourished.  HENT:  Right Ear: External ear and ear canal normal. Tympanic membrane is not erythematous and not retracted.  Left Ear: External ear and ear canal normal. Tympanic membrane is not erythematous and not retracted.  Nose: Right sinus exhibits maxillary sinus tenderness and frontal sinus tenderness. Left sinus exhibits no maxillary sinus tenderness and no frontal sinus tenderness.  Mouth/Throat: Uvula is midline and mucous membranes are normal. No oral lesions. No oropharyngeal exudate or posterior oropharyngeal erythema.  Cardiovascular: Normal rate, regular rhythm and normal heart sounds.   Pulmonary/Chest: Breath sounds normal. She has no wheezes. She has no rales.  Lymphadenopathy:    She has no cervical adenopathy.  Neurological: She is alert and oriented to person, place, and time.    BP 122/80   Pulse 80   Ht 5' 1"  (1.549 m)   Wt 140 lb (63.5 kg)   LMP 12/03/2015 (Approximate)   BMI 26.45 kg/m   Assessment and Plan:  1. Acute non-recurrent maxillary sinusitis Likely viral - begin Flonase NS and Sudafed with Advil as needed Take Zpak if sx worsen or fail to improve - azithromycin (ZITHROMAX Z-PAK) 250 MG tablet; Take 2 on day #1 then 1 a day for four more days  Dispense: 6 each; Refill: 0   Halina Maidens, MD [facility]  12/11/2015

## 2015-12-11 NOTE — Patient Instructions (Signed)
Flonase NS - over the counter and Sudafed 30 mg three times a day Advil for headache or earache Fluids

## 2016-05-30 ENCOUNTER — Other Ambulatory Visit: Payer: Self-pay | Admitting: Obstetrics and Gynecology

## 2016-05-30 DIAGNOSIS — N6320 Unspecified lump in the left breast, unspecified quadrant: Secondary | ICD-10-CM

## 2016-06-21 ENCOUNTER — Other Ambulatory Visit: Payer: 59

## 2016-07-05 ENCOUNTER — Encounter: Payer: Self-pay | Admitting: Internal Medicine

## 2016-07-05 ENCOUNTER — Ambulatory Visit (INDEPENDENT_AMBULATORY_CARE_PROVIDER_SITE_OTHER): Payer: 59 | Admitting: Internal Medicine

## 2016-07-05 VITALS — BP 118/68 | HR 65 | Temp 98.4°F | Ht 61.0 in | Wt 131.0 lb

## 2016-07-05 DIAGNOSIS — R0982 Postnasal drip: Secondary | ICD-10-CM | POA: Diagnosis not present

## 2016-07-05 NOTE — Patient Instructions (Signed)
Xyzal 5 mg over the counter  Continue Nasal spray  Advil 3 times a day for throat pain

## 2016-07-05 NOTE — Progress Notes (Signed)
Date:  07/05/2016   Name:  Renee Price   DOB:  02/12/1970   MRN:  [id number]   Chief Complaint: Sore Throat (X 2 weeks. Started out on right side and is now on left side. Feels slightly swollen. Dry cough. No production. Worse at night. has had a little bit of headache. ) Sore Throat   This is a new problem. The current episode started 1 to 4 weeks ago. The problem has been unchanged. Neither side of throat is experiencing more pain than the other. There has been no fever. Associated symptoms include coughing, headaches and swollen glands. Pertinent negatives include no abdominal pain or trouble swallowing.      Review of Systems  Constitutional: Negative for chills, fatigue and fever.  HENT: Positive for postnasal drip and sore throat. Negative for sinus pressure, trouble swallowing and voice change.   Respiratory: Positive for cough. Negative for chest tightness and wheezing.   Cardiovascular: Negative for chest pain and palpitations.  Gastrointestinal: Negative for abdominal pain.  Neurological: Positive for headaches.    Patient Active Problem List   Diagnosis Date Noted  . Acid reflux 10/17/2014  . CN (constipation) 10/17/2014  . Cannot sleep 10/17/2014  . Allergic rhinitis 10/17/2014  . 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 Authorizing Provider  esomeprazole (NEXIUM) 40 MG capsule Take 1 capsule by mouth every morning.  01/17/12  Yes [provider]  ibuprofen (ADVIL,MOTRIN) 800 MG tablet Take 1 tablet (800 mg total) by mouth every 8 (eight) hours as needed. 11/10/15  Yes Glean Hess, MD  methocarbamol (ROBAXIN) 750 MG tablet Take 750 mg by mouth as needed. 04/12/16   [provider]    No Known Allergies  Past Surgical History:  Procedure Laterality Date  . AUGMENTATION MAMMAPLASTY     2009  . BREAST ENHANCEMENT SURGERY    . COLONOSCOPY  2008  . GANGLION CYST EXCISION  Left 12/07/2014   Procedure: REMOVAL GANGLION CYST FOOT;  Surgeon: Earnestine Leys, MD;  Location: [facility] OR[facility];  Service: Orthopedics;  Laterality: Left;  . TUBAL LIGATION      Social History  Substance Use Topics  . Smoking status: Never Smoker  . Smokeless tobacco: Never Used  . Alcohol use No     Medication list has been reviewed and updated.   Physical Exam  Constitutional: She is oriented to person, place, and time. She appears well-developed. No distress.  HENT:  Head: Normocephalic and atraumatic.  Right Ear: Tympanic membrane and ear canal normal.  Left Ear: Tympanic membrane and ear canal normal.  Nose: Right sinus exhibits no maxillary sinus tenderness and no frontal sinus tenderness. Left sinus exhibits no maxillary sinus tenderness and no frontal sinus tenderness.  Mouth/Throat: Posterior oropharyngeal erythema present. No oropharyngeal exudate, posterior oropharyngeal edema or tonsillar abscesses.  Cardiovascular: Normal rate, regular rhythm and normal heart sounds.   Pulmonary/Chest: Effort normal and breath sounds normal. No respiratory distress.  Musculoskeletal: Normal range of motion.  Neurological: She is alert and oriented to person, place, and time.  Skin: Skin is warm and dry. No rash noted.  Psychiatric: She has a normal mood and affect. Her behavior is normal. Thought content normal.  Nursing note and vitals reviewed.   BP 118/68   Pulse 65   Temp 98.4 F (36.9 C)   Ht 5' 1"  (1.549 m)   Wt 131 lb (59.4 kg)  LMP 07/04/2016   SpO2 98%   BMI 24.75 kg/m   Assessment and Plan: 1. Post-nasal drainage Continue Nasacort Sample of Xyzal advil for pain Call for antibiotics if fever or discolored mucus drainage   No orders of the defined types were placed in this encounter.   Halina Maidens, MD [facility]  07/05/2016

## 2016-07-15 ENCOUNTER — Ambulatory Visit
Admission: RE | Admit: 2016-07-15 | Discharge: 2016-07-15 | Disposition: A | Discharge status: Home / Self Care | Payer: 59 | Source: Ambulatory Visit | Attending: Obstetrics and Gynecology | Admitting: Obstetrics and Gynecology

## 2016-07-15 DIAGNOSIS — N6321 Unspecified lump in the left breast, upper outer quadrant: Secondary | ICD-10-CM | POA: Insufficient documentation

## 2016-07-15 DIAGNOSIS — N6320 Unspecified lump in the left breast, unspecified quadrant: Secondary | ICD-10-CM

## 2016-07-31 ENCOUNTER — Other Ambulatory Visit: Payer: Self-pay | Admitting: Internal Medicine

## 2016-07-31 DIAGNOSIS — M62830 Muscle spasm of back: Secondary | ICD-10-CM

## 2017-04-07 ENCOUNTER — Ambulatory Visit (INDEPENDENT_AMBULATORY_CARE_PROVIDER_SITE_OTHER): Payer: 59 | Admitting: Internal Medicine

## 2017-04-07 ENCOUNTER — Encounter: Payer: Self-pay | Admitting: Internal Medicine

## 2017-04-07 VITALS — BP 142/82 | HR 81 | Ht 61.0 in | Wt 138.0 lb

## 2017-04-07 DIAGNOSIS — R21 Rash and other nonspecific skin eruption: Secondary | ICD-10-CM | POA: Diagnosis not present

## 2017-04-07 MED ORDER — RANITIDINE HCL 300 MG PO CAPS: 300.0000 mg | ORAL_CAPSULE | Freq: Two times a day (BID) | ORAL | 1 refills | Status: DC

## 2017-04-07 NOTE — Progress Notes (Signed)
Date:  04/07/2017   Name:  Renee Price   DOB:  [birth year]   MRN:  [id number]   Chief Complaint: Rash (Started Sat with palms of hands. Hands were blood red- happened this morning for 15 mins. Rash goes from head to toe. Ears felt like swelling form inside out, hot to tough and bright red. Benadryl helps ears go away. Yesterday all the same symptoms except felt like could not swallow pills. Never had allergies. No new nothing- food, lotion, soap.)  Rash  This is a new problem. The current episode started in the past 7 days. The problem has been waxing and waning since onset. The affected locations include the right hand, right ear, left hand, left ear, left foot and right foot. The rash is characterized by redness and itchiness. Pertinent negatives include no cough, fatigue, fever or shortness of breath. Past treatments include antihistamine. The treatment provided significant relief.  She is certain there have been no new exposures.  The benadryl resolves the sx in 15-30 minutes.  She denies significant stress.  Yesterday she had a red papular rash over her entire body.  No wheezing, shortness of breath or trouble swallowing.   Review of Systems  Constitutional: Negative for chills, fatigue and fever.  HENT: Negative for ear discharge and ear pain.   Eyes: Negative for visual disturbance.  Respiratory: Negative for cough, chest tightness, shortness of breath and wheezing.   Cardiovascular: Negative for chest pain and palpitations.  Gastrointestinal: Negative for abdominal pain.  Musculoskeletal: Negative for arthralgias.  Skin: Positive for color change and rash.  Hematological: Negative for adenopathy.  Psychiatric/Behavioral: Negative for dysphoric mood and sleep disturbance.    Patient Active Problem List   Diagnosis Date Noted  . Acid reflux 10/17/2014  . CN (constipation) 10/17/2014  . Cannot sleep 10/17/2014  . Allergic rhinitis 10/17/2014  . 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 Authorizing Provider  esomeprazole (NEXIUM) 40 MG capsule Take 1 capsule by mouth every morning.  01/17/12  Yes [provider]  ibuprofen (ADVIL,MOTRIN) 800 MG tablet TAKE 1 TABLET (800 MG TOTAL) BY MOUTH EVERY 8 (EIGHT) HOURS AS NEEDED. 08/01/16  Yes Glean Hess, MD  methocarbamol (ROBAXIN) 750 MG tablet Take 750 mg by mouth as needed. 04/12/16  Yes [provider]    No Known Allergies  Past Surgical History:  Procedure Laterality Date  . AUGMENTATION MAMMAPLASTY     2009  . BREAST ENHANCEMENT SURGERY    . COLONOSCOPY  2008  . GANGLION CYST EXCISION Left 12/07/2014   Procedure: REMOVAL GANGLION CYST FOOT;  Surgeon: Earnestine Leys, MD;  Location: [facility] ORS;  Service: Orthopedics;  Laterality: Left;  . TUBAL LIGATION      Social History   Tobacco Use  . Smoking status: Never Smoker  . Smokeless tobacco: Never Used  Substance Use Topics  . Alcohol use: No    Alcohol/week: 0.0 oz  . Drug use: No     Medication list has been reviewed and updated.  PHQ 2/9 Scores 04/07/2017  PHQ - 2 Score 0  PHQ- 9 Score 0    Physical Exam  Constitutional: She is oriented to person, place, and time. She appears well-developed. No distress.  HENT:  Head: Normocephalic and atraumatic.  Cardiovascular: Normal rate, regular rhythm and normal heart sounds.  Pulmonary/Chest: Effort normal and breath sounds normal. No respiratory distress.  Musculoskeletal:  Normal range of motion.  Neurological: She is alert and oriented to person, place, and time.  Skin: Skin is warm, dry and intact. No rash noted. There is erythema (of chest and ear lobes).  Psychiatric: She has a normal mood and affect. Her behavior is normal. Thought content normal.  Nursing note and vitals reviewed.   BP (!) 142/82   Pulse 81   Ht 5' 1"  (1.549 m)   Wt 138 lb (62.6 kg)   SpO2 98%   BMI 26.07 kg/m   Assessment and  Plan: 1. Rash and nonspecific skin eruption Allegra 180 mg daily Benadryl PRN - ranitidine (ZANTAC) 300 MG capsule; Take 1 capsule (300 mg total) by mouth 2 (two) times daily.  Dispense: 60 capsule; Refill: 1   Meds ordered this encounter  Medications  . ranitidine (ZANTAC) 300 MG capsule    Sig: Take 1 capsule (300 mg total) by mouth 2 (two) times daily.    Dispense:  60 capsule    Refill:  1    Partially dictated using Editor, commissioning. Any errors are unintentional.  Halina Maidens, MD [facility]  04/07/2017

## 2017-04-07 NOTE — Patient Instructions (Signed)
Take Allegra 180 mg once a day  Take Ranitidine 300 mg twice a day  Continue Benadryl as needed

## 2017-04-08 ENCOUNTER — Other Ambulatory Visit: Payer: Self-pay | Admitting: Internal Medicine

## 2017-04-08 DIAGNOSIS — M62830 Muscle spasm of back: Secondary | ICD-10-CM

## 2017-06-12 ENCOUNTER — Other Ambulatory Visit: Payer: Self-pay | Admitting: Obstetrics and Gynecology

## 2017-06-12 DIAGNOSIS — Z1231 Encounter for screening mammogram for malignant neoplasm of breast: Secondary | ICD-10-CM

## 2017-07-21 ENCOUNTER — Ambulatory Visit
Admission: RE | Admit: 2017-07-21 | Discharge: 2017-07-21 | Disposition: A | Discharge status: Home / Self Care | Payer: 59 | Source: Ambulatory Visit | Attending: Obstetrics and Gynecology | Admitting: Obstetrics and Gynecology

## 2017-07-21 DIAGNOSIS — Z1231 Encounter for screening mammogram for malignant neoplasm of breast: Secondary | ICD-10-CM | POA: Diagnosis present

## 2018-06-30 ENCOUNTER — Other Ambulatory Visit: Payer: Self-pay | Admitting: Obstetrics and Gynecology

## 2018-06-30 DIAGNOSIS — Z1231 Encounter for screening mammogram for malignant neoplasm of breast: Secondary | ICD-10-CM

## 2018-07-30 ENCOUNTER — Ambulatory Visit
Admission: RE | Admit: 2018-07-30 | Discharge: 2018-07-30 | Disposition: A | Discharge status: Home / Self Care | Payer: 59 | Attending: Urology | Admitting: Urology

## 2018-07-30 ENCOUNTER — Other Ambulatory Visit: Payer: Self-pay

## 2018-07-30 ENCOUNTER — Encounter
Admission: RE | Disposition: A | Discharge status: Home / Self Care | Payer: Self-pay | Source: Home / Self Care | Attending: Urology

## 2018-07-30 ENCOUNTER — Encounter: Payer: Self-pay | Admitting: *Deleted

## 2018-07-30 DIAGNOSIS — N200 Calculus of kidney: Secondary | ICD-10-CM | POA: Diagnosis not present

## 2018-07-30 DIAGNOSIS — Z79899 Other long term (current) drug therapy: Secondary | ICD-10-CM | POA: Insufficient documentation

## 2018-07-30 DIAGNOSIS — I10 Essential (primary) hypertension: Secondary | ICD-10-CM | POA: Diagnosis not present

## 2018-07-30 DIAGNOSIS — N202 Calculus of kidney with calculus of ureter: Secondary | ICD-10-CM | POA: Diagnosis present

## 2018-07-30 HISTORY — PX: EXTRACORPOREAL SHOCK WAVE LITHOTRIPSY: SHX1557

## 2018-07-30 LAB — POCT PREGNANCY, URINE: Preg Test, Ur: NEGATIVE

## 2018-07-30 SURGERY — LITHOTRIPSY, ESWL
Anesthesia: Moderate Sedation | Laterality: Left

## 2018-07-30 MED ORDER — MIDAZOLAM HCL 2 MG/2ML IJ SOLN
1.0000 mg | Freq: Once | INTRAMUSCULAR | Status: AC
  Administered 2018-07-30: 1 mg via INTRAMUSCULAR

## 2018-07-30 MED ORDER — FUROSEMIDE 10 MG/ML IJ SOLN
10.0000 mg | Freq: Once | INTRAMUSCULAR | Status: AC
  Administered 2018-07-30: 10 mg via INTRAVENOUS

## 2018-07-30 MED ORDER — DIPHENHYDRAMINE HCL 25 MG PO CAPS
ORAL_CAPSULE | ORAL | Status: AC
  Administered 2018-07-30: 25 mg via ORAL
  Filled 2018-07-30: qty 1

## 2018-07-30 MED ORDER — LEVOFLOXACIN 500 MG PO TABS
500.0000 mg | ORAL_TABLET | Freq: Once | ORAL | Status: AC
  Administered 2018-07-30: 500 mg via ORAL

## 2018-07-30 MED ORDER — DEXTROSE-NACL 5-0.45 % IV SOLN: INTRAVENOUS | Status: DC

## 2018-07-30 MED ORDER — MORPHINE SULFATE (PF) 10 MG/ML IV SOLN
INTRAVENOUS | Status: AC
  Administered 2018-07-30: 10 mg via INTRAMUSCULAR
  Filled 2018-07-30: qty 1

## 2018-07-30 MED ORDER — TAMSULOSIN HCL 0.4 MG PO CAPS: 0.4000 mg | ORAL_CAPSULE | Freq: Every day | ORAL | 0 refills | Status: DC

## 2018-07-30 MED ORDER — SODIUM CHLORIDE 0.9 % IV BOLUS
500.0000 mL | Freq: Once | INTRAVENOUS | Status: AC
  Administered 2018-07-30: 500 mL via INTRAVENOUS

## 2018-07-30 MED ORDER — LEVOFLOXACIN 500 MG PO TABS
ORAL_TABLET | ORAL | Status: AC
  Administered 2018-07-30: 500 mg via ORAL
  Filled 2018-07-30: qty 1

## 2018-07-30 MED ORDER — DIPHENHYDRAMINE HCL 25 MG PO CAPS
25.0000 mg | ORAL_CAPSULE | Freq: Once | ORAL | Status: AC
  Administered 2018-07-30: 13:00:00 25 mg via ORAL

## 2018-07-30 MED ORDER — ONDANSETRON HCL 4 MG/2ML IJ SOLN
INTRAMUSCULAR | Status: AC
  Administered 2018-07-30: 18:00:00 4 mg via INTRAVENOUS
  Filled 2018-07-30: qty 2

## 2018-07-30 MED ORDER — FUROSEMIDE 10 MG/ML IJ SOLN
INTRAMUSCULAR | Status: AC
  Administered 2018-07-30: 10 mg via INTRAVENOUS
  Filled 2018-07-30: qty 2

## 2018-07-30 MED ORDER — ONDANSETRON HCL 4 MG/2ML IJ SOLN
4.0000 mg | Freq: Once | INTRAMUSCULAR | Status: AC
  Administered 2018-07-30: 18:00:00 4 mg via INTRAVENOUS

## 2018-07-30 MED ORDER — MORPHINE SULFATE (PF) 10 MG/ML IV SOLN
10.0000 mg | Freq: Once | INTRAVENOUS | Status: AC
  Administered 2018-07-30: 13:00:00 10 mg via INTRAMUSCULAR

## 2018-07-30 MED ORDER — SODIUM CHLORIDE FLUSH 0.9 % IV SOLN
INTRAVENOUS | Status: AC
  Filled 2018-07-30: qty 10

## 2018-07-30 MED ORDER — PROMETHAZINE HCL 25 MG/ML IJ SOLN
INTRAMUSCULAR | Status: AC
  Administered 2018-07-30: 25 mg via INTRAMUSCULAR
  Filled 2018-07-30: qty 1

## 2018-07-30 MED ORDER — MIDAZOLAM HCL 2 MG/2ML IJ SOLN
INTRAMUSCULAR | Status: AC
  Administered 2018-07-30: 1 mg via INTRAMUSCULAR
  Filled 2018-07-30: qty 2

## 2018-07-30 MED ORDER — PROMETHAZINE HCL 25 MG/ML IJ SOLN
25.0000 mg | Freq: Once | INTRAMUSCULAR | Status: AC
  Administered 2018-07-30: 25 mg via INTRAMUSCULAR

## 2018-07-30 NOTE — Discharge Instructions (Addendum)
Kidney Stones  Kidney stones (urolithiasis) are solid, rock-like deposits that form inside of the organs that make urine (kidneys). A kidney stone may form in a kidney and move into the bladder, where it can cause intense pain and block the flow of urine. Kidney stones are created when high levels of certain minerals are found in the urine. They are usually passed through urination, but in some cases, medical treatment may be needed to remove them. What are the causes? Kidney stones may be caused by:  A condition in which certain glands produce too much parathyroid hormone (primary hyperparathyroidism), which causes too much calcium buildup in the blood.  Buildup of uric acid crystals in the bladder (hyperuricosuria). Uric acid is a chemical that the body produces when you eat certain foods. It usually exits the body in the urine.  Narrowing (stricture) of one or both of the tubes that drain urine from the kidneys to the bladder (ureters).  A kidney blockage that is present at birth (congenital obstruction).  Past surgery on the kidney or the ureters, such as gastric bypass surgery. What increases the risk? The following factors make you more likely to develop kidney stones:  Having had a kidney stone in the past.  Having a family history of kidney stones.  Not drinking enough water.  Eating a diet that is high in protein, salt (sodium), or sugar.  Being overweight or obese. What are the signs or symptoms? Symptoms of a kidney stone may include:  Nausea.  Vomiting.  Blood in the urine (hematuria).  Pain in the side of the abdomen, right below the ribs (flank pain). Pain usually spreads (radiates) to the groin.  Needing to urinate frequently or urgently. How is this diagnosed? This condition may be diagnosed based on:  Your medical history.  A physical exam.  Blood tests.  Urine tests.  CT scan.  Abdominal X-ray.  A procedure to examine the inside of the bladder  (cystoscopy). How is this treated? Treatment for kidney stones depends on the size, location, and makeup of the stones. Treatment may involve:  Analyzing your urine before and after you pass the stone through urination.  Being monitored at the hospital until you pass the stone through urination.  Increasing your fluid intake and decreasing the amount of calcium and protein in your diet.  A procedure to break up kidney stones in the bladder using: ? A focused beam of light (laser therapy). ? Shock waves (extracorporeal shock wave lithotripsy).  Surgery to remove kidney stones. This may be needed if you have severe pain or have stones that block your urinary tract. Follow these instructions at home: Eating and drinking  Drink enough fluid to keep your urine clear or pale yellow. This will help you to pass the kidney stone.  If directed, change your diet. This may include: ? Limiting how much sodium you eat. ? Eating more fruits and vegetables. ? Limiting how much meat, poultry, fish, and eggs you eat.  Follow instructions from your health care provider about eating or drinking restrictions. General instructions  Collect urine samples as told by your health care provider. You may need to collect a urine sample: ? 24 hours after you pass the stone. ? 8-12 weeks after passing the kidney stone, and every 6-12 months after that.  Strain your urine every time you urinate, for as long as directed. Use the strainer that your health care provider recommends.  Do not throw out the kidney stone after passing  it. Keep the stone so it can be tested by your health care provider. Testing the makeup of your kidney stone may help prevent you from getting kidney stones in the future.  Take over-the-counter and prescription medicines only as told by your health care provider.  Keep all follow-up visits as told by your health care provider. This is important. You may need follow-up X-rays or  ultrasounds to make sure that your stone has passed. How is this prevented? To prevent another kidney stone:  Drink enough fluid to keep your urine clear or pale yellow. This is the best way to prevent kidney stones.  Eat a healthy diet and follow recommendations from your health care provider about foods to avoid. You may be instructed to eat a low-protein diet. Recommendations vary depending on the type of kidney stone that you have.  Maintain a healthy weight. Contact a health care provider if:  You have pain that gets worse or does not get better with medicine. Get help right away if:  You have a fever or chills.  You develop severe pain.  You develop new abdominal pain.  You faint.  You are unable to urinate. This information is not intended to replace advice given to you by your health care provider. Make sure you discuss any questions you have with your health care provider. Document Released: 12/31/2004 Document Revised: 08/12/2017 Document Reviewed: 06/16/2015 Elsevier Patient Education  2020 [place] After This sheet gives you information about how to care for yourself after your procedure. Your health care provider may also give you more specific instructions. If you have problems or questions, contact your health care provider. What can I expect after the procedure? After the procedure, it is common to have:  Some blood in your urine. This should only last for a few days.  Soreness in your back, sides, or upper abdomen for a few days.  Blotches or bruises on your back where the pressure wave entered the skin.  Pain, discomfort, or nausea when pieces (fragments) of the kidney stone move through the tube that carries urine from the kidney to the bladder (ureter). Stone fragments may pass soon after the procedure, but they may continue to pass for up to 4-8 weeks. ? If you have severe pain or nausea, contact your health care provider. This may  be caused by a large stone that was not broken up, and this may mean that you need more treatment.  Some pain or discomfort during urination.  Some pain or discomfort in the lower abdomen or (in men) at the base of the penis. Follow these instructions at home: Medicines  Take over-the-counter and prescription medicines only as told by your health care provider.  If you were prescribed an antibiotic medicine, take it as told by your health care provider. Do not stop taking the antibiotic even if you start to feel better.  Do not drive for 24 hours if you were given a medicine to help you relax (sedative).  Do not drive or use heavy machinery while taking prescription pain medicine. Eating and drinking      Drink enough water and fluids to keep your urine clear or pale yellow. This helps any remaining pieces of the stone to pass. It can also help prevent new stones from forming.  Eat plenty of fresh fruits and vegetables.  Follow instructions from your health care provider about eating and drinking restrictions. You may be instructed: ? To reduce how much  salt (sodium) you eat or drink. Check ingredients and nutrition facts on packaged foods and beverages. ? To reduce how much meat you eat.  Eat the recommended amount of calcium for your age and gender. Ask your health care provider how much calcium you should have. General instructions  Get plenty of rest.  Most people can resume normal activities 1-2 days after the procedure. Ask your health care provider what activities are safe for you.  Your health care provider may direct you to lie in a certain position (postural drainage) and tap firmly (percuss) over your kidney area to help stone fragments pass. Follow instructions as told by your health care provider.  If directed, strain all urine through the strainer that was provided by your health care provider. ? Keep all fragments for your health care provider to see. Any stones  that are found may be sent to a medical lab for examination. The stone may be as small as a grain of salt.  Keep all follow-up visits as told by your health care provider. This is important. Contact a health care provider if:  You have pain that is severe or does not get better with medicine.  You have nausea that is severe or does not go away.  You have blood in your urine longer than your health care provider told you to expect.  You have more blood in your urine.  You have pain during urination that does not go away.  You urinate more frequently than usual and this does not go away.  You develop a rash or any other possible signs of an allergic reaction. Get help right away if:  You have severe pain in your back, sides, or upper abdomen.  You have severe pain while urinating.  Your urine is very dark red.  You have blood in your stool (feces).  You cannot pass any urine at all.  You feel a strong urge to urinate after emptying your bladder.  You have a fever or chills.  You develop shortness of breath, difficulty breathing, or chest pain.  You have severe nausea that leads to persistent vomiting.  You faint. Summary  After this procedure, it is common to have some pain, discomfort, or nausea when pieces (fragments) of the kidney stone move through the tube that carries urine from the kidney to the bladder (ureter). If this pain or nausea is severe, however, you should contact your health care provider.  Most people can resume normal activities 1-2 days after the procedure. Ask your health care provider what activities are safe for you.  Drink enough water and fluids to keep your urine clear or pale yellow. This helps any remaining pieces of the stone to pass, and it can help prevent new stones from forming.  If directed, strain your urine and keep all fragments for your health care provider to see. Fragments or stones may be as small as a grain of salt.  Get help  right away if you have severe pain in your back, sides, or upper abdomen or have severe pain while urinating. This information is not intended to replace advice given to you by your health care provider. Make sure you discuss any questions you have with your health care provider. Document Released: 01/20/2007 Document Revised: 04/13/2018 Document Reviewed: 11/22/2015 Elsevier Patient Education  2020 [employer]   1) The drugs that you were given will stay in your system until tomorrow so for the  next 24 hours you should not:  A) Drive an automobile B) Make any legal decisions C) Drink any alcoholic beverage   2) You may resume regular meals tomorrow.  Today it is better to start with liquids and gradually work up to solid foods.  You may eat anything you prefer, but it is better to start with liquids, then soup and crackers, and gradually work up to solid foods.   3) Please notify your doctor immediately if you have any unusual bleeding, trouble breathing, redness and pain at the surgery site, drainage, fever, or pain not relieved by medication.    4) Additional Instructions:        Please contact your physician with any problems or Same Day Surgery at [phone], Monday through Friday 6 am to 4 pm, or [place] at [facility] number at [phone].

## 2018-07-31 ENCOUNTER — Encounter: Payer: Self-pay | Admitting: Urology

## 2018-08-07 ENCOUNTER — Ambulatory Visit
Admission: RE | Admit: 2018-08-07 | Discharge: 2018-08-07 | Disposition: A | Discharge status: Home / Self Care | Payer: 59 | Source: Ambulatory Visit | Attending: Obstetrics and Gynecology | Admitting: Obstetrics and Gynecology

## 2018-08-07 DIAGNOSIS — Z1231 Encounter for screening mammogram for malignant neoplasm of breast: Secondary | ICD-10-CM

## 2019-04-29 IMAGING — US US BREAST*L* LIMITED INC AXILLA
1 series · 7 of 7 positions shown · non-contrast
Comparison: Previous exam(s).

CLINICAL DATA: 45-year-old female for follow-up of a probably
benign left breast mass

EXAM:
2D DIGITAL DIAGNOSTIC BILATERAL MAMMOGRAM WITH IMPLANTS, CAD AND
ADJUNCT TOMO
ULTRASOUND LEFT BREAST
The patient has retropectoral implants. Standard and implant
displaced views were performed.

[Series 1: us breast*left* limited inc axilla · 0.03mm/px · 7 of 7 slices shown]
[im 1/7]
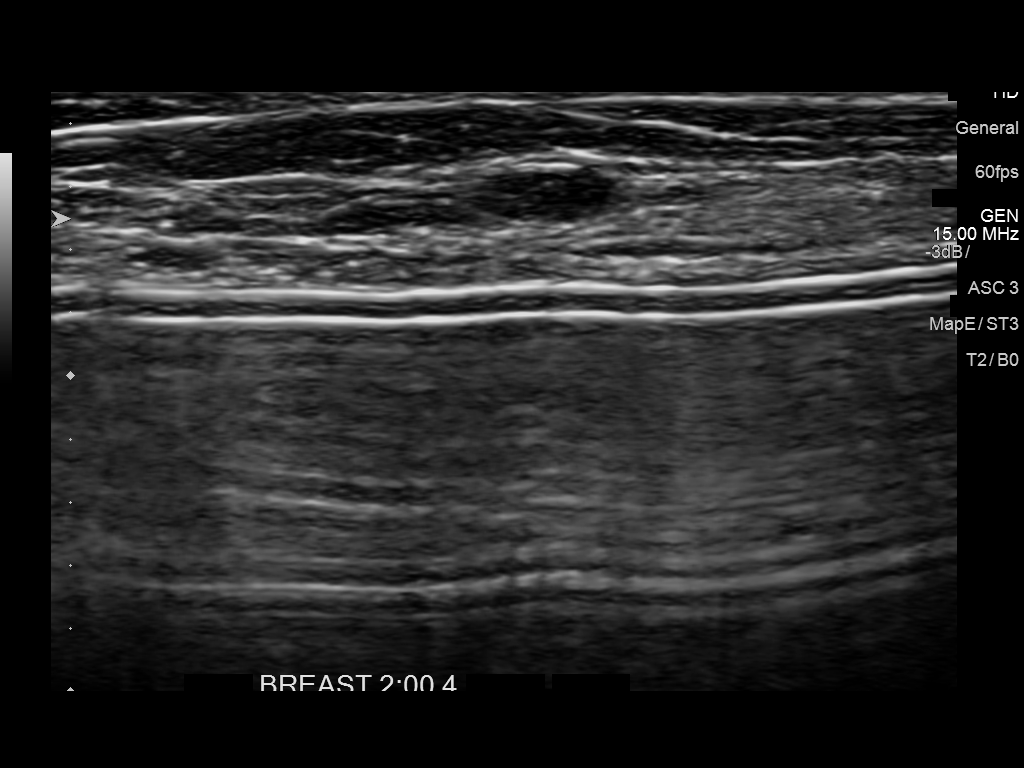
[im 2/7]
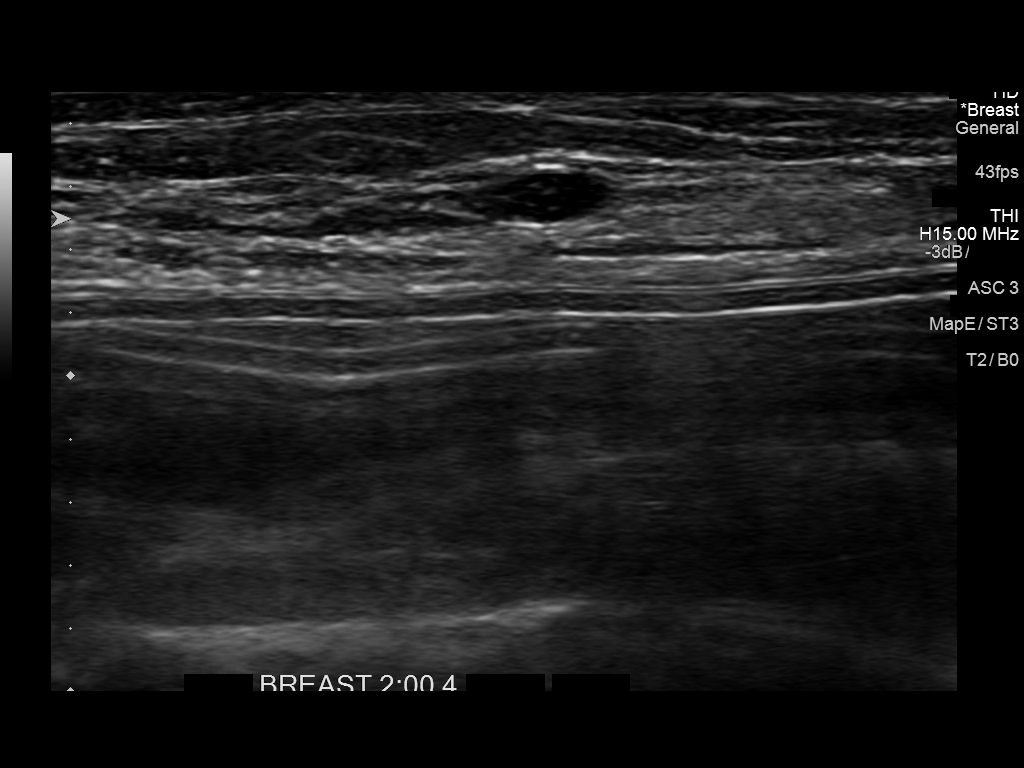
[im 3/7]
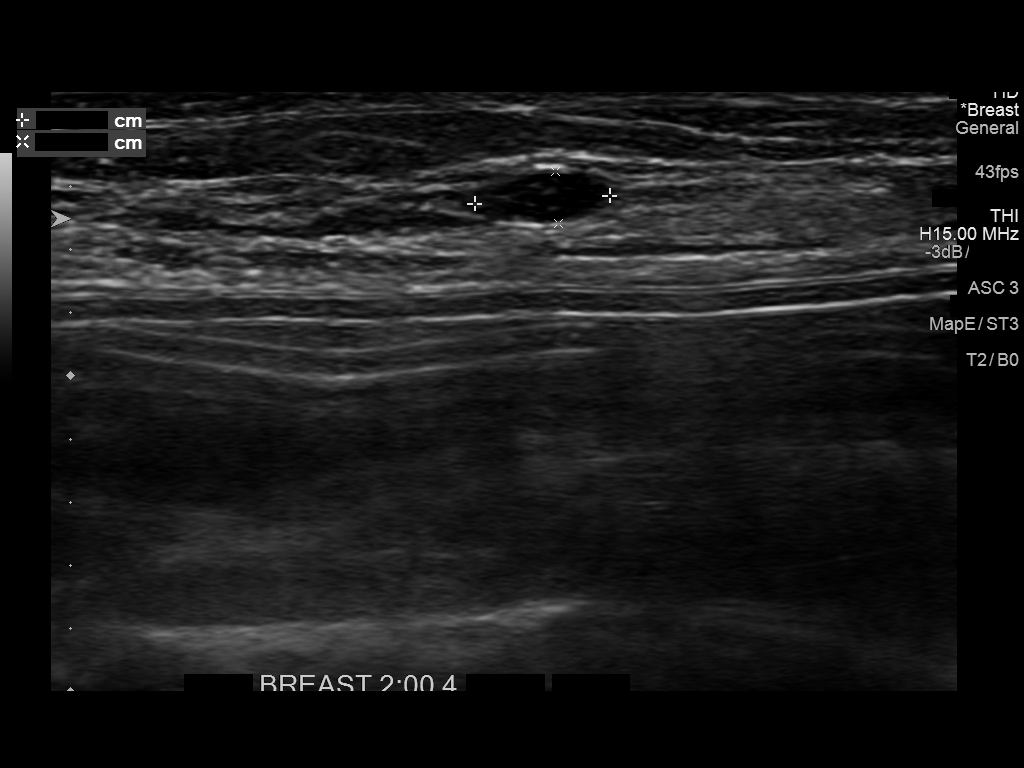
[im 4/7]
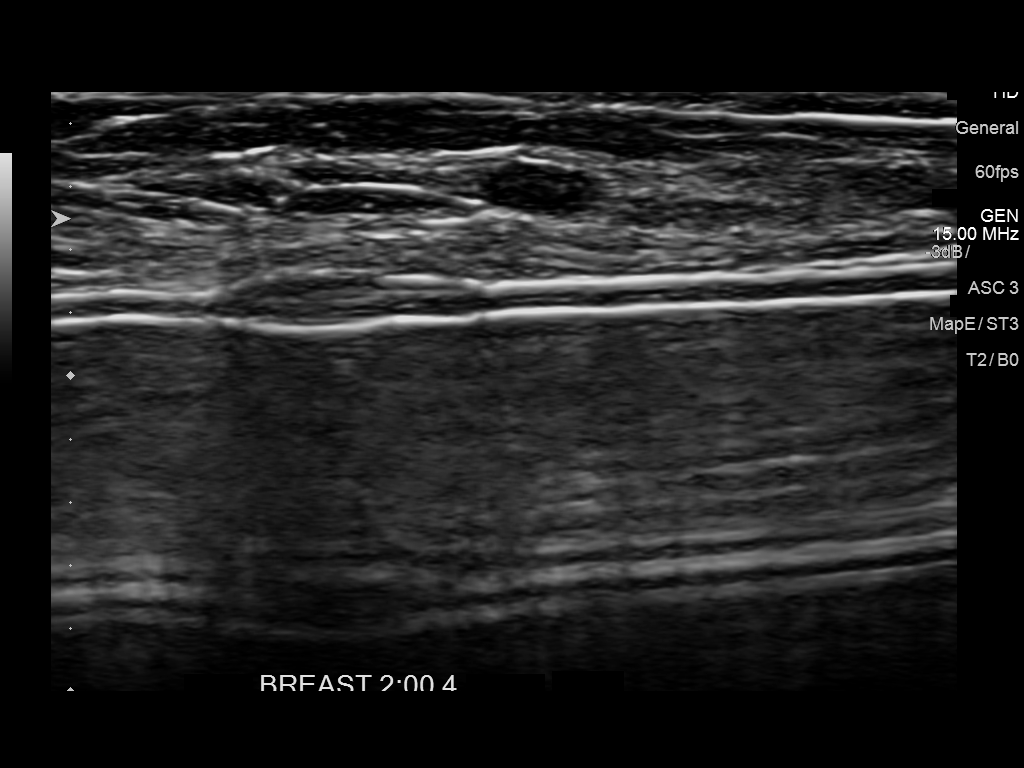
[im 5/7]
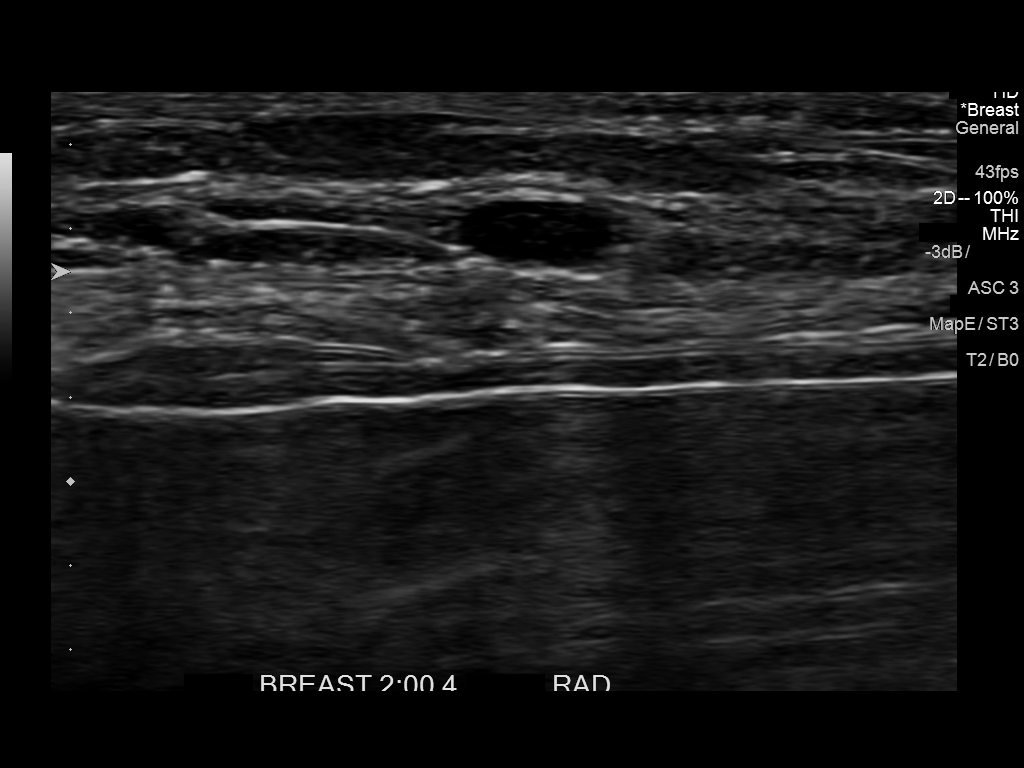
[im 6/7]
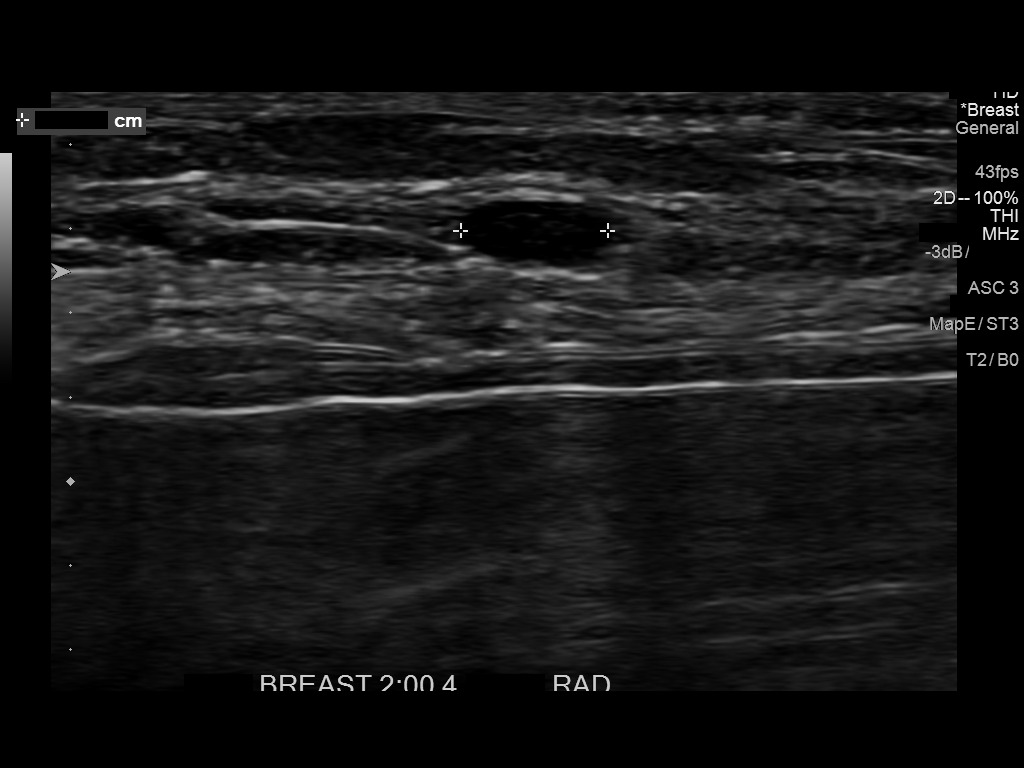
[im 7/7]
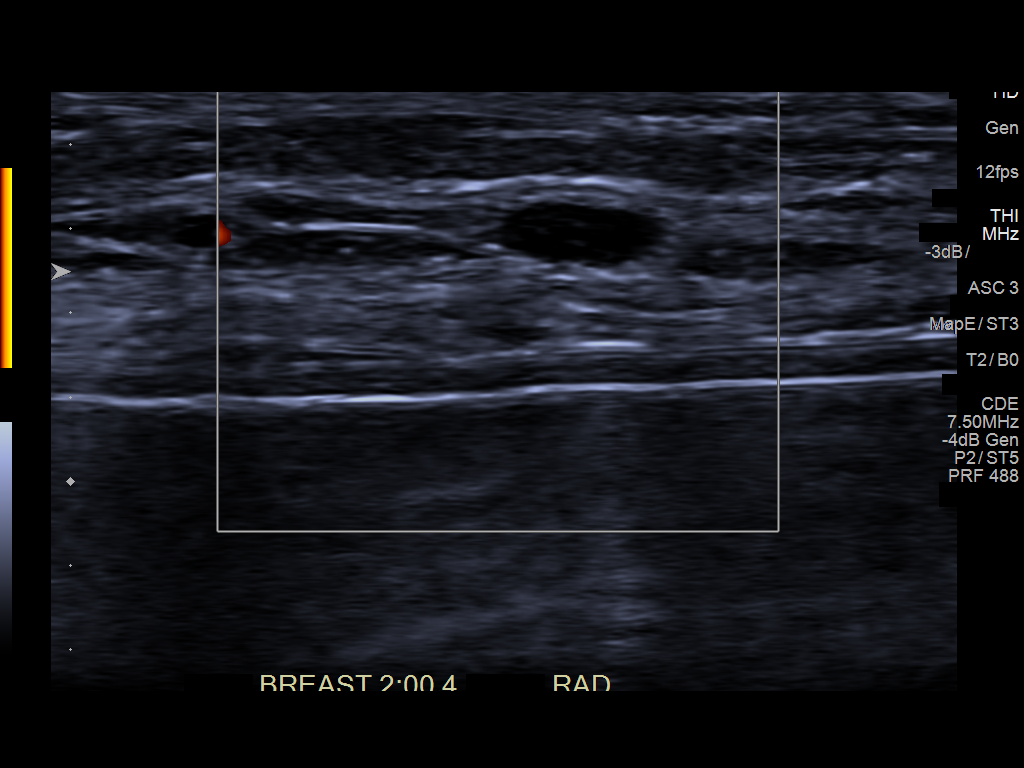

[7 of 7 positions shown; findings below may reference images not displayed]

ACR Breast Density Category c: The breast tissue is heterogeneously
dense, which may obscure small masses.
FINDINGS: No suspicious mass, calcifications, or other abnormality is
identified within either breast.

Targeted ultrasound of the lateral left breast was performed
demonstrating a similar-appearing oval, circumscribed, near anechoic
mass at 2 o'clock, 4 cm from the nipple measuring 4 x 2 x 4 mm, not
significantly changed from prior exams dating back to 04/18/2014,
consistent with a benign etiology.

Mammographic images were processed with CAD.
IMPRESSION: No mammographic or sonographic evidence of malignancy.

RECOMMENDATION:
Screening mammogram in one year.(Code:CG-[id number])

I have discussed the findings and recommendations with the patient.
Results were also provided in writing at the conclusion of the
visit. If applicable, a reminder letter will be sent to the patient
regarding the next appointment.

BI-RADS CATEGORY  2: Benign.

## 2019-07-06 ENCOUNTER — Other Ambulatory Visit: Payer: Self-pay | Admitting: Obstetrics and Gynecology

## 2019-07-06 DIAGNOSIS — Z1231 Encounter for screening mammogram for malignant neoplasm of breast: Secondary | ICD-10-CM

## 2019-08-09 ENCOUNTER — Ambulatory Visit
Admission: RE | Admit: 2019-08-09 | Discharge: 2019-08-09 | Disposition: A | Discharge status: Home / Self Care | Payer: 59 | Source: Ambulatory Visit | Attending: Obstetrics and Gynecology | Admitting: Obstetrics and Gynecology

## 2019-08-09 DIAGNOSIS — Z1231 Encounter for screening mammogram for malignant neoplasm of breast: Secondary | ICD-10-CM | POA: Insufficient documentation

## 2020-01-14 ENCOUNTER — Ambulatory Visit: Admit: 2020-01-14 | Discharge status: Against Medical Advice | Payer: 59

## 2020-07-13 ENCOUNTER — Other Ambulatory Visit: Payer: Self-pay | Admitting: Obstetrics and Gynecology

## 2020-07-13 DIAGNOSIS — Z1231 Encounter for screening mammogram for malignant neoplasm of breast: Secondary | ICD-10-CM

## 2020-08-10 ENCOUNTER — Other Ambulatory Visit: Payer: Self-pay

## 2020-08-10 ENCOUNTER — Ambulatory Visit
Admission: RE | Admit: 2020-08-10 | Discharge: 2020-08-10 | Disposition: A | Discharge status: Home / Self Care | Payer: 59 | Source: Ambulatory Visit | Attending: Obstetrics and Gynecology | Admitting: Obstetrics and Gynecology

## 2020-08-10 DIAGNOSIS — Z1231 Encounter for screening mammogram for malignant neoplasm of breast: Secondary | ICD-10-CM | POA: Diagnosis not present

## 2021-01-26 DIAGNOSIS — F5222 Female sexual arousal disorder: Secondary | ICD-10-CM | POA: Diagnosis not present

## 2021-01-27 DIAGNOSIS — M7541 Impingement syndrome of right shoulder: Secondary | ICD-10-CM | POA: Diagnosis not present

## 2021-01-30 DIAGNOSIS — J343 Hypertrophy of nasal turbinates: Secondary | ICD-10-CM | POA: Diagnosis not present

## 2021-01-30 DIAGNOSIS — J342 Deviated nasal septum: Secondary | ICD-10-CM | POA: Diagnosis not present

## 2021-02-01 ENCOUNTER — Encounter: Payer: Self-pay | Admitting: Otolaryngology

## 2021-02-01 ENCOUNTER — Other Ambulatory Visit: Payer: Self-pay

## 2021-02-09 NOTE — Discharge Instructions (Signed)
[facility]  What is Functional Endoscopic Sinus Surgery?  The Surgery involves making the natural openings of the sinuses larger by removing the bony partitions that separate the sinuses from the nasal cavity.  The natural sinus lining is preserved as much as possible to allow the sinuses to resume normal function after the surgery.  In some patients nasal polyps (excessively swollen lining of the sinuses) may be removed to relieve obstruction of the sinus openings.  The surgery is performed through the nose using lighted scopes, which eliminates the need for incisions on the face.  A septoplasty is a different procedure which is sometimes performed with sinus surgery.  It involves straightening the boy partition that separates the two sides of your nose.  A crooked or deviated septum may need repair if is obstructing the sinuses or nasal airflow.  Turbinate reduction is also often performed during sinus surgery.  The turbinates are bony proturberances from the side walls of the nose which swell and can obstruct the nose in patients with sinus and allergy problems.  Their size can be surgically reduced to help relieve nasal obstruction.  What Can Sinus Surgery Do For Me?  Sinus surgery can reduce the frequency of sinus infections requiring antibiotic treatment.  This can provide improvement in nasal congestion, post-nasal drainage, facial pressure and nasal obstruction.  Surgery will NOT prevent you from ever having an infection again, so it usually only for patients who get infections 4 or more times yearly requiring antibiotics, or for infections that do not clear with antibiotics.  It will not cure nasal allergies, so patients with allergies may still require medication to treat their allergies after surgery. Surgery may improve headaches related to sinusitis, however, some people will continue to  require medication to control sinus headaches related to allergies.  Surgery will do nothing for other forms of headache (migraine, tension or cluster).  What Are the Risks of Endoscopic Sinus Surgery?  Current techniques allow surgery to be performed safely with little risk, however, there are rare complications that patients should be aware of.  Because the sinuses are located around the eyes, there is risk of eye injury, including blindness, though again, this would be quite rare. This is usually a result of bleeding behind the eye during surgery, which can effect vision, though there are treatments to protect the vision and prevent permanent injury. More serious complications would include bleeding inside the brain cavity or damage to the brain.This happens when the fluid around the brain leaks out into the sinus cavity.  Again, all of these complications are uncommon, and spinal fluid leaks can be safely managed surgically if they occur.  The most common complication of sinus surgery is bleeding from the nose, which may require packing or cauterization of the nose.  Patients with polyps may experience recurrence of the polyps that would require revision surgery.  Alterations of sense of smell or injury to the tear ducts are also rare complications.   What is the Surgery Like, and what is the Recovery?  The Surgery usually takes a couple of hours to perform, and is usually performed under a general anesthetic (completely asleep).  Patients are usually discharged home after a couple of hours.  Sometimes during surgery it is necessary to pack the nose to control bleeding, and the packing is left in place for 24 - 48 hours, and removed by your surgeon.  If  a septoplasty was performed during the procedure, there is often a splint placed which must be removed after 5-7 days.   Discomfort: Pain is usually mild to moderate, and can be controlled by prescription pain medication or acetaminophen (Tylenol).   Aspirin, Ibuprofen (Advil, Motrin), or Naprosyn (Aleve) should be avoided, as they can cause increased bleeding.  Most patients feel sinus pressure like they have a bad head cold for several days.  Sleeping with your head elevated can help reduce swelling and facial pressure, as can ice packs over the face.  A humidifier may be helpful to keep the mucous and blood from drying in the nose.   Diet: There are no specific diet restrictions, however, you should generally start with clear liquids and a light diet of bland foods because the anesthetic can cause some nausea.  Advance your diet depending on how your stomach feels.  Taking your pain medication with food will often help reduce stomach upset which pain medications can cause.  Nasal Saline Irrigation: It is important to remove blood clots and dried mucous from the nose as it is healing.  This is done by having you irrigate the nose at least 3 - 4 times daily with a salt water solution.  We recommend using NeilMed Sinus Rinse (available at the drug store).  Fill the squeeze bottle with the solution, bend over a sink, and insert the tip of the squeeze bottle into the nose  of an inch.  Point the tip of the squeeze bottle towards the inside corner of the eye on the same side your irrigating.  Squeeze the bottle and gently irrigate the nose.  If you bend forward as you do this, most of the fluid will flow back out of the nose, instead of down your throat.   The solution should be warm, near body temperature, when you irrigate.   Each time you irrigate, you should use a full squeeze bottle.   Note that if you are instructed to use Nasal Steroid Sprays at any time after your surgery, irrigate with saline BEFORE using the steroid spray, so you do not wash it all out of the nose. Another product, Nasal Saline Gel (such as AYR Nasal Saline Gel) can be applied in each nostril 3 - 4 times daily to moisture the nose and reduce scabbing or crusting.  Bleeding:   Bloody drainage from the nose can be expected for several days, and patients are instructed to irrigate their nose frequently with salt water to help remove mucous and blood clots.  The drainage may be dark red or brown, though some fresh blood may be seen intermittently, especially after irrigation.  Do not blow you nose, as bleeding may occur. If you must sneeze, keep your mouth open to allow air to escape through your mouth.  If heavy bleeding occurs: Irrigate the nose with saline to rinse out clots, then spray the nose 3 - 4 times with Afrin Nasal Decongestant Spray.  The spray will constrict the blood vessels to slow bleeding.  Pinch the lower half of your nose shut to apply pressure, and lay down with your head elevated.  Ice packs over the nose may help as well. If bleeding persists despite these measures, you should notify your doctor.  Do not use the Afrin routinely to control nasal congestion after surgery, as it can result in worsening congestion and may affect healing.     Activity: Return to work varies among patients. Most patients will be out  of work at least 5 - 7 days to recover.  Patient may return to work after they are off of narcotic pain medication, and feeling well enough to perform the functions of their job.  Patients must avoid heavy lifting (over 10 pounds) or strenuous physical for 2 weeks after surgery, so your employer may need to assign you to light duty, or keep you out of work longer if light duty is not possible.  NOTE: you should not drive, operate dangerous machinery, do any mentally demanding tasks or make any important legal or financial decisions while on narcotic pain medication and recovering from the general anesthetic.    Call Your Doctor Immediately if You Have Any of the Following: Bleeding that you cannot control with the above measures Loss of vision, double vision, bulging of the eye or black eyes. Fever over 101 degrees Neck stiffness with severe headache,  fever, nausea and change in mental state. You are always encouraged to call anytime with concerns, however, please call with requests for pain medication refills during office hours.  Office Endoscopy: During follow-up visits your doctor will remove any packing or splints that may have been placed and evaluate and clean your sinuses endoscopically.  Topical anesthetic will be used to make this as comfortable as possible, though you may want to take your pain medication prior to the visit.  How often this will need to be done varies from patient to patient.  After complete recovery from the surgery, you may need follow-up endoscopy from time to time, particularly if there is concern of recurrent infection or nasal polyps.

## 2021-02-15 ENCOUNTER — Encounter: Payer: Self-pay | Admitting: Otolaryngology

## 2021-02-15 ENCOUNTER — Ambulatory Visit: Payer: BC Managed Care – PPO | Admitting: Anesthesiology

## 2021-02-15 ENCOUNTER — Encounter
Admission: RE | Disposition: A | Discharge status: Home / Self Care | Payer: Self-pay | Source: Home / Self Care | Attending: Otolaryngology

## 2021-02-15 ENCOUNTER — Other Ambulatory Visit: Payer: Self-pay

## 2021-02-15 ENCOUNTER — Ambulatory Visit
Admission: RE | Admit: 2021-02-15 | Discharge: 2021-02-15 | Disposition: A | Discharge status: Home / Self Care | Payer: BC Managed Care – PPO | Attending: Otolaryngology | Admitting: Otolaryngology

## 2021-02-15 DIAGNOSIS — J343 Hypertrophy of nasal turbinates: Secondary | ICD-10-CM | POA: Diagnosis not present

## 2021-02-15 DIAGNOSIS — J342 Deviated nasal septum: Secondary | ICD-10-CM | POA: Diagnosis not present

## 2021-02-15 HISTORY — PX: NASAL SEPTOPLASTY W/ TURBINOPLASTY: SHX2070

## 2021-02-15 LAB — POCT PREGNANCY, URINE: Preg Test, Ur: NEGATIVE

## 2021-02-15 SURGERY — SEPTOPLASTY, NOSE, WITH NASAL TURBINATE REDUCTION
Anesthesia: General | Laterality: Bilateral

## 2021-02-15 MED ORDER — FLUCONAZOLE 100 MG PO TABS: ORAL_TABLET | ORAL | 0 refills | Status: DC

## 2021-02-15 MED ORDER — PHENYLEPHRINE HCL 0.5 % NA SOLN
NASAL | Status: DC | PRN
  Administered 2021-02-15: 30 mL via NASAL

## 2021-02-15 MED ORDER — PROPOFOL 10 MG/ML IV BOLUS
INTRAVENOUS | Status: DC | PRN
  Administered 2021-02-15: 130 mg via INTRAVENOUS

## 2021-02-15 MED ORDER — FENTANYL CITRATE PF 50 MCG/ML IJ SOSY: 25.0000 ug | PREFILLED_SYRINGE | INTRAMUSCULAR | Status: DC | PRN

## 2021-02-15 MED ORDER — CEPHALEXIN 500 MG PO CAPS: 500.0000 mg | ORAL_CAPSULE | Freq: Two times a day (BID) | ORAL | 0 refills | Status: DC

## 2021-02-15 MED ORDER — ONDANSETRON HCL 4 MG/2ML IJ SOLN: 4.0000 mg | Freq: Once | INTRAMUSCULAR | Status: DC | PRN

## 2021-02-15 MED ORDER — SCOPOLAMINE 1 MG/3DAYS TD PT72
1.0000 | MEDICATED_PATCH | TRANSDERMAL | Status: DC
  Administered 2021-02-15: 1.5 mg via TRANSDERMAL

## 2021-02-15 MED ORDER — LACTATED RINGERS IV SOLN: INTRAVENOUS | Status: DC

## 2021-02-15 MED ORDER — PREDNISONE 10 MG PO TABS: ORAL_TABLET | ORAL | 0 refills | Status: DC

## 2021-02-15 MED ORDER — ACETAMINOPHEN 10 MG/ML IV SOLN
1000.0000 mg | Freq: Once | INTRAVENOUS | Status: AC
  Administered 2021-02-15: 1000 mg via INTRAVENOUS

## 2021-02-15 MED ORDER — GLYCOPYRROLATE 0.2 MG/ML IJ SOLN
INTRAMUSCULAR | Status: DC | PRN
  Administered 2021-02-15: .1 mg via INTRAVENOUS

## 2021-02-15 MED ORDER — OXYMETAZOLINE HCL 0.05 % NA SOLN
2.0000 | Freq: Once | NASAL | Status: AC
  Administered 2021-02-15: 2 via NASAL

## 2021-02-15 MED ORDER — FENTANYL CITRATE (PF) 100 MCG/2ML IJ SOLN
INTRAMUSCULAR | Status: DC | PRN
  Administered 2021-02-15 (×2): 50 ug via INTRAVENOUS

## 2021-02-15 MED ORDER — OXYCODONE HCL 5 MG/5ML PO SOLN: 5.0000 mg | Freq: Once | ORAL | Status: DC | PRN

## 2021-02-15 MED ORDER — LIDOCAINE-EPINEPHRINE 1 %-1:100000 IJ SOLN
INTRAMUSCULAR | Status: DC | PRN
  Administered 2021-02-15: 6 mL

## 2021-02-15 MED ORDER — DEXTROSE 5 % IV SOLN
2000.0000 mg | Freq: Once | INTRAVENOUS | Status: AC
  Administered 2021-02-15: 2000 mg via INTRAVENOUS

## 2021-02-15 MED ORDER — HYDROCODONE-ACETAMINOPHEN 5-325 MG PO TABS: 1.0000 | ORAL_TABLET | Freq: Four times a day (QID) | ORAL | 0 refills | Status: AC | PRN

## 2021-02-15 MED ORDER — DEXAMETHASONE SODIUM PHOSPHATE 4 MG/ML IJ SOLN
INTRAMUSCULAR | Status: DC | PRN
  Administered 2021-02-15: 8 mg via INTRAVENOUS

## 2021-02-15 MED ORDER — KETOROLAC TROMETHAMINE 30 MG/ML IJ SOLN: 15.0000 mg | Freq: Once | INTRAMUSCULAR | Status: DC | PRN

## 2021-02-15 MED ORDER — OXYCODONE HCL 5 MG PO TABS: 5.0000 mg | ORAL_TABLET | Freq: Once | ORAL | Status: DC | PRN

## 2021-02-15 MED ORDER — ONDANSETRON HCL 4 MG/2ML IJ SOLN
INTRAMUSCULAR | Status: DC | PRN
  Administered 2021-02-15: 4 mg via INTRAVENOUS

## 2021-02-15 MED ORDER — LIDOCAINE HCL (CARDIAC) PF 100 MG/5ML IV SOSY
PREFILLED_SYRINGE | INTRAVENOUS | Status: DC | PRN
  Administered 2021-02-15: 50 mg via INTRAVENOUS

## 2021-02-15 MED ORDER — SUCCINYLCHOLINE CHLORIDE 200 MG/10ML IV SOSY
PREFILLED_SYRINGE | INTRAVENOUS | Status: DC | PRN
  Administered 2021-02-15: 100 mg via INTRAVENOUS

## 2021-02-15 MED ORDER — MIDAZOLAM HCL 5 MG/5ML IJ SOLN
INTRAMUSCULAR | Status: DC | PRN
  Administered 2021-02-15: 2 mg via INTRAVENOUS

## 2021-02-15 SURGICAL SUPPLY — 28 items
CANISTER SUCT 1200ML W/VALVE (MISCELLANEOUS) ×2 IMPLANT
COAGULATOR SUCT 8FR VV (MISCELLANEOUS) ×2 IMPLANT
ELECT REM PT RETURN 9FT ADLT (ELECTROSURGICAL) ×2
ELECTRODE REM PT RTRN 9FT ADLT (ELECTROSURGICAL) ×1 IMPLANT
GLOVE SURG GAMMEX PI TX LF 7.5 (GLOVE) ×4 IMPLANT
GOWN STRL REUS W/ TWL LRG LVL3 (GOWN DISPOSABLE) ×1 IMPLANT
GOWN STRL REUS W/TWL LRG LVL3 (GOWN DISPOSABLE) ×2
KIT TURNOVER KIT A (KITS) ×2 IMPLANT
NDL ANESTHESIA 27G X 3.5 (NEEDLE) ×1 IMPLANT
NDL HYPO 27GX1-1/4 (NEEDLE) ×1 IMPLANT
NEEDLE ANESTHESIA  27G X 3.5 (NEEDLE) ×1
NEEDLE ANESTHESIA 27G X 3.5 (NEEDLE) ×1 IMPLANT
NEEDLE HYPO 27GX1-1/4 (NEEDLE) ×2 IMPLANT
PACK ENT CUSTOM (PACKS) ×2 IMPLANT
PACKING NASAL EPIS 4X2.4 XEROG (MISCELLANEOUS) ×1 IMPLANT
PATTIES SURGICAL .5 X3 (DISPOSABLE) ×2 IMPLANT
SOL ANTI-FOG 6CC FOG-OUT (MISCELLANEOUS) ×1 IMPLANT
SOL FOG-OUT ANTI-FOG 6CC (MISCELLANEOUS) ×1
SPLINT NASAL SEPTAL BLV .50 ST (MISCELLANEOUS) ×2 IMPLANT
STRAP BODY AND KNEE 60X3 (MISCELLANEOUS) ×2 IMPLANT
SUT CHROMIC 3-0 (SUTURE) ×2
SUT CHROMIC 3-0 KS 27XMFL CR (SUTURE) ×1
SUT ETHILON 3-0 KS 30 BLK (SUTURE) ×1 IMPLANT
SUT PLAIN GUT 4-0 (SUTURE) ×2 IMPLANT
SUTURE CHRMC 3-0 KS 27XMFL CR (SUTURE) ×1 IMPLANT
SYR 3ML LL SCALE MARK (SYRINGE) ×2 IMPLANT
TOWEL OR 17X26 4PK STRL BLUE (TOWEL DISPOSABLE) ×2 IMPLANT
WATER STERILE IRR 250ML POUR (IV SOLUTION) ×2 IMPLANT

## 2021-02-15 NOTE — H&P (Signed)
H&P has been reviewed and patient reevaluated, no changes necessary. To be downloaded later.  

## 2021-02-15 NOTE — Transfer of Care (Signed)
Immediate Anesthesia Transfer of Care Note  Patient: Renee Price  Procedure(s) Performed: NASAL SEPTOPLASTY WITH INFERIOR TURBINATE REDUCTION (Bilateral)  Patient Location: PACU  Anesthesia Type: General  Level of Consciousness: awake, alert  and patient cooperative  Airway and Oxygen Therapy: Patient Spontanous Breathing and Patient connected to supplemental oxygen  Post-op Assessment: Post-op Vital signs reviewed, Patient's Cardiovascular Status Stable, Respiratory Function Stable, Patent Airway and No signs of Nausea or vomiting  Post-op Vital Signs: Reviewed and stable  Complications: No notable events documented.

## 2021-02-15 NOTE — Op Note (Signed)
02/15/2021  9:23 AM  [id number]   Pre-Op Dx:  Deviated Nasal Septum, Hypertrophic Inferior Turbinates  Post-op Dx: Same  Proc: Nasal Septoplasty, Bilateral Partial Reduction Inferior Turbinates   Surg:  Elon Alas Della Scrivener  Anes:  GOT  EBL: 50 mL  Comp: None  Findings: Cartilaginous septum buckled to the left side anteriorly.  Part of the ethmoid plate buckled to the right posteriorly.  Procedure: With the patient in a comfortable supine position,  general orotracheal anesthesia was induced without difficulty.     The patient received preoperative Afrin spray for topical decongestion and vasoconstriction.  Intravenous prophylactic antibiotics were administered.  At an appropriate level, the patient was placed in a semi-sitting position.  Nasal vibrissae were trimmed.   1% Xylocaine with 1:100,000 epinephrine, 6 cc's, was infiltrated into the anterior floor of the nose, into the nasal spine region, into the membranous columella, and finally into the submucoperichondrial plane of the septum on both sides.  Several minutes were allowed for this to take effect.  Cottoniod pledgetts soaked in Afrin and 4% Xylocaine were placed into both nasal cavities and left while the patient was prepped and draped in the standard fashion.  The materials were removed from the nose and observed to be intact and correct in number.  The nose was inspected with a headlight and zero degree scope with the findings as described above.  A left hemitransfixion incision was sharply executed and carried down to the quadrangular cartilage. The mucoperichondrium was elelvated along the quadrangular plate back to the bony-cartilaginous junction. The mucoperiostium was then elevated along the ethmoid plate and the vomer. The boney-catilaginous junction was then split with a freer elevator and the mucoperiosteum was elevated on the opposite side. The mucoperiosteum was then elevated along the maxillary crest as needed to expose  the crooked bone of the crest.  Boney spurs of the vomer and maxillary crest were removed with Donavan Foil forceps.  The cartilaginous plate was trimmed along its posterior and inferior borders of about 3 mm of cartilage to free it up inferiorly. Some of the deviated ethmoid plate was then fractured and removed with Takahashi forceps to free up the posterior border of the quadrangular plate and allow it to swing back to the midline. The mucosal flaps were placed back into their anatomic position to allow visualization of the airways. The septum now sat in the midline with an improved airway.  A 3-0 Chromic suture on a Keith needle in used to anchor the inferior septum at the nasal spine with a through and through suture. The mucosal flaps are then sutured together using a through and through whip stitch of 4-0 Plain Gut with a mini-Keith needle. This was used to close the hemitransfixion incision as well.   The inferior turbinates were then inspected. An incision was created along the inferior aspect of the left inferior turbinate with removal of some of the inferior soft tissue and bone. Electrocautery was used to control bleeding in the area. The remaining turbinate was then outfractured to open up the airway further. There was no significant bleeding noted. The right turbinate was then trimmed and outfractured in a similar fashion.  The 0 degree scope was used to visualize the airway and the right middle turbinate was sitting lateralized because of the previously deviated ethmoid plate.  The middle turbinate was infractured some to open up the middle meatus.  A small piece of xerogel was placed into the middle meatus to help hold this more open.  The left middle turbinate was sitting in its normal position  The airways were then visualized and showed open passageways on both sides that were significantly improved compared to before surgery. There was no signifcant bleeding. Nasal splints were applied to  both sides of the septum using Xomed 0.73m regular sized splints that were trimmed, and then held in position with a 3-0 Nylon through and through suture.  The patient was turned back over to anesthesia, and awakened, extubated, and taken to the PACU in satisfactory condition.  She tolerated the procedure well.  There were no operative complications.  Dispo:   PACU to home  Plan: Ice, elevation, narcotic analgesia, steroid taper, and prophylactic antibiotics for the duration of indwelling nasal foreign bodies.  We will reevaluate the patient in the office in 6 days and remove the septal splints.  Return to work in 10 days, strenuous activities in two weeks.   PElon AlasJuengel 02/15/2021 9:23 AM

## 2021-02-15 NOTE — Anesthesia Postprocedure Evaluation (Signed)
Anesthesia Post Note  Patient: Renee Price  Procedure(s) Performed: NASAL SEPTOPLASTY WITH INFERIOR TURBINATE REDUCTION (Bilateral)     Patient location during evaluation: PACU Anesthesia Type: General Level of consciousness: awake and alert Pain management: pain level controlled Vital Signs Assessment: post-procedure vital signs reviewed and stable Respiratory status: spontaneous breathing, nonlabored ventilation, respiratory function stable and patient connected to nasal cannula oxygen Cardiovascular status: blood pressure returned to baseline and stable Postop Assessment: no apparent nausea or vomiting Anesthetic complications: no   No notable events documented.  Sinda Du

## 2021-02-15 NOTE — Anesthesia Procedure Notes (Signed)
Procedure Name: Intubation Date/Time: 02/15/2021 8:19 AM Performed by: Cameron Ali, CRNA Pre-anesthesia Checklist: Patient identified, Emergency Drugs available, Suction available, Patient being monitored and Timeout performed Patient Re-evaluated:Patient Re-evaluated prior to induction Oxygen Delivery Method: Circle system utilized Preoxygenation: Pre-oxygenation with 100% oxygen Induction Type: IV induction Ventilation: Mask ventilation without difficulty Laryngoscope Size: Mac and 2 Grade View: Grade I Tube type: Oral Rae Tube size: 7.0 mm Number of attempts: 1 Placement Confirmation: ETT inserted through vocal cords under direct vision, positive ETCO2 and breath sounds checked- equal and bilateral Tube secured with: Tape Dental Injury: Teeth and Oropharynx as per pre-operative assessment

## 2021-02-15 NOTE — Anesthesia Preprocedure Evaluation (Signed)
Anesthesia Evaluation  Patient identified by MRN, date of birth, ID band Patient awake    Reviewed: Allergy & Precautions, NPO status , Patient's Chart, lab work & pertinent test results, reviewed documented beta blocker date and time   Airway Mallampati: II  TM Distance: >3 FB     Dental  (+) Chipped   Pulmonary neg pulmonary ROS,    Pulmonary exam normal breath sounds clear to auscultation       Cardiovascular Exercise Tolerance: Good negative cardio ROS Normal cardiovascular exam Rhythm:Regular Rate:Normal     Neuro/Psych negative neurological ROS  negative psych ROS   GI/Hepatic GERD  Controlled,  Endo/Other  negative endocrine ROS  Renal/GU Renal diseaseKidney cyst  negative genitourinary   Musculoskeletal negative musculoskeletal ROS (+)   Abdominal Normal abdominal exam  (+)  Abdomen: soft.    Peds negative pediatric ROS (+)  Hematology negative hematology ROS (+)   Anesthesia Other Findings   Reproductive/Obstetrics negative OB ROS                             Anesthesia Physical  Anesthesia Plan  ASA: II  Anesthesia Plan: General   Post-op Pain Management:    Induction: Intravenous  PONV Risk Score and Plan: 3 and Treatment may vary due to age or medical condition, Midazolam, Ondansetron and Dexamethasone  Airway Management Planned: Oral ETT  Additional Equipment:   Intra-op Plan:   Post-operative Plan:   Informed Consent: I have reviewed the patients History and Physical, chart, labs and discussed the procedure including the risks, benefits and alternatives for the proposed anesthesia with the patient or authorized representative who has indicated his/her understanding and acceptance.     Dental advisory given  Plan Discussed with: CRNA and Anesthesiologist  Anesthesia Plan Comments:         Anesthesia Quick Evaluation  Patient Active Problem  List   Diagnosis Date Noted   Acid reflux 10/17/2014   CN (constipation) 10/17/2014   Cannot sleep 10/17/2014   Allergic rhinitis 10/17/2014   Renal cyst, left 10/16/2014    No flowsheet data found. No flowsheet data found.  Risks and benefits of anesthesia discussed at length, patient or surrogate demonstrates understanding. Appropriately NPO. Plan to proceed with anesthesia.  Champ Mungo, MD 02/15/21

## 2021-02-16 ENCOUNTER — Encounter: Payer: Self-pay | Admitting: Otolaryngology

## 2021-02-20 DIAGNOSIS — J3489 Other specified disorders of nose and nasal sinuses: Secondary | ICD-10-CM | POA: Diagnosis not present

## 2021-02-23 DIAGNOSIS — F5222 Female sexual arousal disorder: Secondary | ICD-10-CM | POA: Diagnosis not present

## 2021-02-28 DIAGNOSIS — J3489 Other specified disorders of nose and nasal sinuses: Secondary | ICD-10-CM | POA: Diagnosis not present

## 2021-03-14 DIAGNOSIS — J3489 Other specified disorders of nose and nasal sinuses: Secondary | ICD-10-CM | POA: Diagnosis not present

## 2021-03-22 DIAGNOSIS — F5222 Female sexual arousal disorder: Secondary | ICD-10-CM | POA: Diagnosis not present

## 2021-04-27 DIAGNOSIS — F5222 Female sexual arousal disorder: Secondary | ICD-10-CM | POA: Diagnosis not present

## 2021-05-24 DIAGNOSIS — K5901 Slow transit constipation: Secondary | ICD-10-CM | POA: Diagnosis not present

## 2021-05-24 DIAGNOSIS — Z124 Encounter for screening for malignant neoplasm of cervix: Secondary | ICD-10-CM | POA: Diagnosis not present

## 2021-05-24 DIAGNOSIS — I10 Essential (primary) hypertension: Secondary | ICD-10-CM | POA: Diagnosis not present

## 2021-05-24 DIAGNOSIS — Z1211 Encounter for screening for malignant neoplasm of colon: Secondary | ICD-10-CM | POA: Diagnosis not present

## 2021-05-24 DIAGNOSIS — Z1283 Encounter for screening for malignant neoplasm of skin: Secondary | ICD-10-CM | POA: Diagnosis not present

## 2021-05-25 DIAGNOSIS — F5222 Female sexual arousal disorder: Secondary | ICD-10-CM | POA: Diagnosis not present

## 2021-05-29 ENCOUNTER — Encounter: Payer: Self-pay | Admitting: Family Medicine

## 2021-05-29 ENCOUNTER — Telehealth: Payer: Self-pay

## 2021-05-29 NOTE — Telephone Encounter (Signed)
Gastroenterology Pre-Procedure Review ? ?Request Date: TBD Pt has requested an office visit to discuss IBS-Constipation ?Requesting Physician: Dr. Dellie Catholic ? ?PATIENT REVIEW QUESTIONS: The patient responded to the following health history questions as indicated:   ? ?1. Are you having any GI issues? yes (IBS constipation) ?2. Do you have a personal history of Polyps? no ?3. Do you have a family history of Colon Cancer or Polyps? yes (father colon polyps) ?4. Diabetes Mellitus? no ?5. Joint replacements in the past 12 months? Septoplasty with Dr. Ladene Artist  02/15/21 ?6. Major health/ problems in the past 3 months?no ?7. Any artificial heart valves, MVP, or defibrillator?no ?   ?MEDICATIONS & ALLERGIES:    ?Patient reports the following regarding taking any anticoagulation/antiplatelet therapy:   ?Plavix, Coumadin, Eliquis, Xarelto, Lovenox, Pradaxa, Brilinta, or Effient? no ?Aspirin? no ? ?Patient confirms/reports the following medications:  ?Current Outpatient Medications  ?Medication Sig Dispense Refill  ? amLODipine (NORVASC) 5 MG tablet Take 5 mg by mouth daily.    ? cephALEXin (KEFLEX) 500 MG capsule Take 1 capsule (500 mg total) by mouth 2 (two) times daily. 14 capsule 0  ? fluconazole (DIFLUCAN) 100 MG tablet Take one tablet at onset of symptoms, then repeat second dose 2 days later 2 tablet 0  ? ibuprofen (ADVIL,MOTRIN) 800 MG tablet TAKE 1 TABLET BY MOUTH EVERY 8 HOURS AS NEEDED 90 tablet 1  ? lisinopril (ZESTRIL) 30 MG tablet Take 30 mg by mouth daily.    ? meloxicam (MOBIC) 7.5 MG tablet Take 7.5 mg by mouth daily.    ? methocarbamol (ROBAXIN) 750 MG tablet Take 750 mg by mouth as needed.  1  ? mirabegron ER (MYRBETRIQ) 25 MG TB24 tablet Take 25 mg by mouth daily.    ? predniSONE (DELTASONE) 10 MG tablet Start with 3 pills tomorrow. Taper over the next 6 days.  3,3,2,2,1,1. 12 tablet 0  ? ?No current facility-administered medications for this visit.  ? ? ?Patient confirms/reports the following allergies:  ?No  Known Allergies ? ?No orders of the defined types were placed in this encounter. ? ? ?AUTHORIZATION INFORMATION ?Primary Insurance: ?1D#: ?Group #: ? ?Secondary Insurance: ?1D#: ?Group #: ? ?SCHEDULE INFORMATION: ?Date:  ?Time: ?Location:  ?

## 2021-06-08 DIAGNOSIS — M7541 Impingement syndrome of right shoulder: Secondary | ICD-10-CM | POA: Diagnosis not present

## 2021-06-22 DIAGNOSIS — F5222 Female sexual arousal disorder: Secondary | ICD-10-CM | POA: Diagnosis not present

## 2021-07-18 DIAGNOSIS — Z01411 Encounter for gynecological examination (general) (routine) with abnormal findings: Secondary | ICD-10-CM | POA: Diagnosis not present

## 2021-07-18 DIAGNOSIS — R232 Flushing: Secondary | ICD-10-CM | POA: Diagnosis not present

## 2021-07-18 DIAGNOSIS — L659 Nonscarring hair loss, unspecified: Secondary | ICD-10-CM | POA: Diagnosis not present

## 2021-07-18 DIAGNOSIS — Z1231 Encounter for screening mammogram for malignant neoplasm of breast: Secondary | ICD-10-CM | POA: Diagnosis not present

## 2021-07-18 DIAGNOSIS — F5222 Female sexual arousal disorder: Secondary | ICD-10-CM | POA: Diagnosis not present

## 2021-07-20 DIAGNOSIS — F5222 Female sexual arousal disorder: Secondary | ICD-10-CM | POA: Diagnosis not present

## 2021-07-20 DIAGNOSIS — L659 Nonscarring hair loss, unspecified: Secondary | ICD-10-CM | POA: Diagnosis not present

## 2021-07-20 DIAGNOSIS — R232 Flushing: Secondary | ICD-10-CM | POA: Diagnosis not present

## 2021-07-24 DIAGNOSIS — Z6824 Body mass index (BMI) 24.0-24.9, adult: Secondary | ICD-10-CM | POA: Diagnosis not present

## 2021-07-24 DIAGNOSIS — D229 Melanocytic nevi, unspecified: Secondary | ICD-10-CM | POA: Diagnosis not present

## 2021-08-02 ENCOUNTER — Other Ambulatory Visit: Payer: Self-pay | Admitting: Obstetrics and Gynecology

## 2021-08-02 DIAGNOSIS — Z1231 Encounter for screening mammogram for malignant neoplasm of breast: Secondary | ICD-10-CM

## 2021-08-24 ENCOUNTER — Ambulatory Visit
Admission: RE | Admit: 2021-08-24 | Discharge: 2021-08-24 | Disposition: A | Discharge status: Home / Self Care | Payer: BC Managed Care – PPO | Source: Ambulatory Visit | Attending: Obstetrics and Gynecology | Admitting: Obstetrics and Gynecology

## 2021-08-24 DIAGNOSIS — Z1231 Encounter for screening mammogram for malignant neoplasm of breast: Secondary | ICD-10-CM | POA: Diagnosis not present

## 2021-08-29 ENCOUNTER — Ambulatory Visit (INDEPENDENT_AMBULATORY_CARE_PROVIDER_SITE_OTHER): Payer: BC Managed Care – PPO | Admitting: Gastroenterology

## 2021-08-29 ENCOUNTER — Encounter: Payer: Self-pay | Admitting: Gastroenterology

## 2021-08-29 VITALS — BP 132/82 | HR 62 | Temp 98.7°F | Ht 61.0 in | Wt 123.0 lb

## 2021-08-29 DIAGNOSIS — Z1211 Encounter for screening for malignant neoplasm of colon: Secondary | ICD-10-CM | POA: Diagnosis not present

## 2021-08-29 MED ORDER — NA SULFATE-K SULFATE-MG SULF 17.5-3.13-1.6 GM/177ML PO SOLN: 1.0000 | Freq: Once | ORAL | 0 refills | Status: AC

## 2021-08-29 MED ORDER — LINACLOTIDE 290 MCG PO CAPS
290.0000 ug | ORAL_CAPSULE | Freq: Every day | ORAL | 0 refills | Status: DC
Start: 2021-08-29 — End: 2022-12-16

## 2021-08-29 NOTE — Progress Notes (Unsigned)
Gastroenterology Consultation  Referring Provider:     Magnus Ivan Lincoln Brigham, MD Primary Care Physician:  Schermerhorn, Gwen Her, MD Primary Gastroenterologist:  Dr. Allen Norris     Reason for Consultation:     Constipation        HPI:   Renee Price is a 51 y.o. y/o female referred for consultation & management of constipation by Dr. Ouida Sills, Gwen Her, MD. This patient comes in today with a report of needing a screening colonoscopy.  The patient has a history of chronic constipation.  The constipation has been bothering her for some time and she was put on Linzess 145 mcg which she states works but gives her diarrhea.  She also reports that she feels like she has incomplete evacuation when she does move her bowels.  There is no report of any unexplained weight loss fevers chills nausea vomiting black stools or bloody stools.  There is some left-sided abdominal pain when she has the constipation.  She is not sure that the pain ever really goes away even after she moves her bowels.  Past Medical History:  Diagnosis Date   Chronic kidney disease    KIDNEY STONE   GERD (gastroesophageal reflux disease)    Kidney stone     Past Surgical History:  Procedure Laterality Date   AUGMENTATION MAMMAPLASTY Bilateral 2009   BREAST ENHANCEMENT SURGERY     COLONOSCOPY  2008   EXTRACORPOREAL SHOCK WAVE LITHOTRIPSY Left 07/30/2018   Procedure: EXTRACORPOREAL SHOCK WAVE LITHOTRIPSY (ESWL);  Surgeon: Royston Cowper, MD;  Location: [facility];  Service: Urology;  Laterality: Left;   GANGLION CYST EXCISION Left 12/07/2014   Procedure: REMOVAL GANGLION CYST FOOT;  Surgeon: Earnestine Leys, MD;  Location: [facility];  Service: Orthopedics;  Laterality: Left;   NASAL SEPTOPLASTY W/ TURBINOPLASTY Bilateral 02/15/2021   Procedure: NASAL SEPTOPLASTY WITH INFERIOR TURBINATE REDUCTION;  Surgeon: Margaretha Sheffield, MD;  Location: [place];  Service: ENT;  Laterality: Bilateral;   TUBAL LIGATION      Prior to  Admission medications   Medication Sig Start Date End Date Taking? Authorizing Provider  amLODipine (NORVASC) 5 MG tablet Take 5 mg by mouth daily.    [provider]  cephALEXin (KEFLEX) 500 MG capsule Take 1 capsule (500 mg total) by mouth 2 (two) times daily. 02/15/21   Margaretha Sheffield, MD  fluconazole (DIFLUCAN) 100 MG tablet Take one tablet at onset of symptoms, then repeat second dose 2 days later 02/15/21   Margaretha Sheffield, MD  ibuprofen (ADVIL,MOTRIN) 800 MG tablet TAKE 1 TABLET BY MOUTH EVERY 8 HOURS AS NEEDED 04/08/17   Glean Hess, MD  lisinopril (ZESTRIL) 30 MG tablet Take 30 mg by mouth daily.    [provider]  meloxicam (MOBIC) 7.5 MG tablet Take 7.5 mg by mouth daily.    [provider]  methocarbamol (ROBAXIN) 750 MG tablet Take 750 mg by mouth as needed. 04/12/16   [provider]  mirabegron ER (MYRBETRIQ) 25 MG TB24 tablet Take 25 mg by mouth daily.    [provider]  predniSONE (DELTASONE) 10 MG tablet Start with 3 pills tomorrow. Taper over the next 6 days.  3,3,2,2,1,1. 02/15/21   Margaretha Sheffield, MD    Family History  Problem Relation Age of Onset   CAD Father    Hypertension Father    Hypertension Mother    Breast cancer Maternal Grandmother 55   Breast cancer Paternal Grandmother 56     Social History  Tobacco Use   Smoking status: Never   Smokeless tobacco: Never  Vaping Use   Vaping Use: Never used  Substance Use Topics   Alcohol use: No    Alcohol/week: 0.0 standard drinks of alcohol   Drug use: No    Allergies as of 08/29/2021   (No Known Allergies)    Review of Systems:    All systems reviewed and negative except where noted in HPI.   Physical Exam:  There were no vitals taken for this visit. No LMP recorded. General:   Alert,  Well-developed, well-nourished, pleasant and cooperative in NAD Head:  Normocephalic and atraumatic. Eyes:  Sclera clear, no icterus.   Conjunctiva pink. Ears:  Normal  auditory acuity. Neck:  Supple; no masses or thyromegaly. Lungs:  Respirations even and unlabored.  Clear throughout to auscultation.   No wheezes, crackles, or rhonchi. No acute distress. Heart:  Regular rate and rhythm; no murmurs, clicks, rubs, or gallops. Abdomen:  Normal bowel sounds.  No bruits.  Soft, non-tender and non-distended without masses, hepatosplenomegaly or hernias noted.  No guarding or rebound tenderness.  Negative Carnett sign.   Rectal:  Deferred.  Pulses:  Normal pulses noted. Extremities:  No clubbing or edema.  No cyanosis. Neurologic:  Alert and oriented x3;  grossly normal neurologically. Skin:  Intact without significant lesions or rashes.  No jaundice. Lymph Nodes:  No significant cervical adenopathy. Psych:  Alert and cooperative. Normal mood and affect.  Imaging Studies: MM 3D SCREEN BREAST W/IMPLANT BILATERAL  Result Date: 08/27/2021 CLINICAL DATA:  Screening. EXAM: DIGITAL SCREENING BILATERAL MAMMOGRAM WITH IMPLANTS, CAD AND TOMOSYNTHESIS TECHNIQUE: Bilateral screening digital craniocaudal and mediolateral oblique mammograms were obtained. Bilateral screening digital breast tomosynthesis was performed. The images were evaluated with computer-aided detection. Standard and/or implant displaced views were performed. COMPARISON:  Previous exam(s). ACR Breast Density Category c: The breast tissue is heterogeneously dense, which may obscure small masses. FINDINGS: The patient has retropectoral implants. There are no findings suspicious for malignancy. IMPRESSION: No mammographic evidence of malignancy. A result letter of this screening mammogram will be mailed directly to the patient. RECOMMENDATION: Screening mammogram in one year. (Code:SM-B-01Y) BI-RADS CATEGORY  1: Negative. Electronically Signed   By: Franki Cabot M.D.   On: 08/27/2021 08:14    Assessment and Plan:   Renee Price is a 51 y.o. y/o female who comes in today with a history of constipation and is on  Linzess 145 mcg once a day.  She states that she take it half hour before eating.  She also reports that it works by giving her some diarrhea but she never feels like she is completely evacuated.  The patient is also concerned that she will not get cleaned out for the colonoscopy due to her chronic constipation. Since the patient has had results with the Linzess 145 mcg she will be given the 290 mcg as samples to take for a few days prior to her colonoscopy to get her better cleaned out for the procedure.  The patient states that she works from home and this should not be a problem.  Further recommendations such as whether she should be switched to another medication will be dependent on what we find during the colonoscopy but she has also been told that she can take Citrucel and other fibers to help bulk up her stools and get more complete evacuation.  The patient has been explained the plan and agrees with it.    Lucilla Lame, MD. Marval Regal  Note: This dictation was prepared with Dragon dictation along with smaller phrase technology. Any transcriptional errors that result from this process are unintentional.

## 2021-09-04 ENCOUNTER — Encounter: Payer: Self-pay | Admitting: Gastroenterology

## 2021-09-06 NOTE — Anesthesia Preprocedure Evaluation (Addendum)
Anesthesia Evaluation  Patient identified by MRN, date of birth, ID band Patient awake    Reviewed: Allergy & Precautions, NPO status , Patient's Chart, lab work & pertinent test results, reviewed documented beta blocker date and time   Airway Mallampati: II  TM Distance: >3 FB     Dental no notable dental hx.    Pulmonary neg pulmonary ROS,    Pulmonary exam normal breath sounds clear to auscultation       Cardiovascular Exercise Tolerance: Good hypertension, Pt. on medications Normal cardiovascular exam Rhythm:Regular Rate:Normal     Neuro/Psych negative neurological ROS  negative psych ROS   GI/Hepatic GERD  Controlled,  Endo/Other  negative endocrine ROS  Renal/GU Renal diseaseKidney cyst  negative genitourinary   Musculoskeletal negative musculoskeletal ROS (+)   Abdominal Normal abdominal exam  (+)  Abdomen: soft.    Peds negative pediatric ROS (+)  Hematology negative hematology ROS (+)   Anesthesia Other Findings   Reproductive/Obstetrics negative OB ROS                            Anesthesia Physical  Anesthesia Plan  ASA: II  Anesthesia Plan: General   Post-op Pain Management: Minimal or no pain anticipated   Induction: Intravenous  PONV Risk Score and Plan: 3 and Propofol infusion and TIVA  Airway Management Planned: Natural Airway  Additional Equipment:   Intra-op Plan:   Post-operative Plan:   Informed Consent: I have reviewed the patients History and Physical, chart, labs and discussed the procedure including the risks, benefits and alternatives for the proposed anesthesia with the patient or authorized representative who has indicated his/her understanding and acceptance.     Dental advisory given  Plan Discussed with: CRNA and Anesthesiologist  Anesthesia Plan Comments:        Anesthesia Quick Evaluation  Patient Active Problem List    Diagnosis Date Noted  . Acid reflux 10/17/2014  . CN (constipation) 10/17/2014  . Cannot sleep 10/17/2014  . Allergic rhinitis 10/17/2014  . Renal cyst, left 10/16/2014        No data to display             No data to display          Risks and benefits of anesthesia discussed at length, patient or surrogate demonstrates understanding. Appropriately NPO. Plan to proceed with anesthesia.  Champ Mungo, MD 09/06/21

## 2021-09-14 ENCOUNTER — Ambulatory Visit
Admission: RE | Admit: 2021-09-14 | Discharge: 2021-09-14 | Disposition: A | Discharge status: Home / Self Care | Payer: BC Managed Care – PPO | Attending: Gastroenterology | Admitting: Gastroenterology

## 2021-09-14 ENCOUNTER — Encounter
Admission: RE | Disposition: A | Discharge status: Home / Self Care | Payer: Self-pay | Source: Home / Self Care | Attending: Gastroenterology

## 2021-09-14 ENCOUNTER — Other Ambulatory Visit: Payer: Self-pay

## 2021-09-14 ENCOUNTER — Ambulatory Visit: Payer: BC Managed Care – PPO | Admitting: General Practice

## 2021-09-14 ENCOUNTER — Encounter: Payer: Self-pay | Admitting: Gastroenterology

## 2021-09-14 DIAGNOSIS — N189 Chronic kidney disease, unspecified: Secondary | ICD-10-CM | POA: Insufficient documentation

## 2021-09-14 DIAGNOSIS — K219 Gastro-esophageal reflux disease without esophagitis: Secondary | ICD-10-CM | POA: Diagnosis not present

## 2021-09-14 DIAGNOSIS — I129 Hypertensive chronic kidney disease with stage 1 through stage 4 chronic kidney disease, or unspecified chronic kidney disease: Secondary | ICD-10-CM | POA: Diagnosis not present

## 2021-09-14 DIAGNOSIS — Z8249 Family history of ischemic heart disease and other diseases of the circulatory system: Secondary | ICD-10-CM | POA: Diagnosis not present

## 2021-09-14 DIAGNOSIS — K5730 Diverticulosis of large intestine without perforation or abscess without bleeding: Secondary | ICD-10-CM | POA: Insufficient documentation

## 2021-09-14 DIAGNOSIS — K640 First degree hemorrhoids: Secondary | ICD-10-CM | POA: Insufficient documentation

## 2021-09-14 DIAGNOSIS — Z1211 Encounter for screening for malignant neoplasm of colon: Secondary | ICD-10-CM | POA: Insufficient documentation

## 2021-09-14 DIAGNOSIS — Z7689 Persons encountering health services in other specified circumstances: Secondary | ICD-10-CM

## 2021-09-14 HISTORY — PX: COLONOSCOPY: SHX5424

## 2021-09-14 LAB — POCT PREGNANCY, URINE: Preg Test, Ur: NEGATIVE

## 2021-09-14 SURGERY — COLONOSCOPY
Anesthesia: General | Site: Rectum

## 2021-09-14 MED ORDER — PROPOFOL 10 MG/ML IV BOLUS
INTRAVENOUS | Status: DC | PRN
  Administered 2021-09-14: 50 mg via INTRAVENOUS
  Administered 2021-09-14 (×2): 100 mg via INTRAVENOUS
  Administered 2021-09-14 (×2): 25 mg via INTRAVENOUS

## 2021-09-14 MED ORDER — LACTATED RINGERS IV SOLN: INTRAVENOUS | Status: DC

## 2021-09-14 MED ORDER — STERILE WATER FOR IRRIGATION IR SOLN
Status: DC | PRN
  Administered 2021-09-14: 150 mL

## 2021-09-14 MED ORDER — SODIUM CHLORIDE 0.9 % IV SOLN: INTRAVENOUS | Status: DC

## 2021-09-14 MED ORDER — STERILE WATER FOR IRRIGATION IR SOLN
Status: DC | PRN
  Administered 2021-09-14: 120 mL

## 2021-09-14 SURGICAL SUPPLY — 6 items
GOWN CVR UNV OPN BCK APRN NK (MISCELLANEOUS) ×2 IMPLANT
GOWN ISOL THUMB LOOP REG UNIV (MISCELLANEOUS) ×2
KIT PRC NS LF DISP ENDO (KITS) ×1 IMPLANT
KIT PROCEDURE OLYMPUS (KITS) ×1
MANIFOLD NEPTUNE II (INSTRUMENTS) ×1 IMPLANT
WATER STERILE IRR 250ML POUR (IV SOLUTION) ×1 IMPLANT

## 2021-09-14 NOTE — Anesthesia Postprocedure Evaluation (Signed)
Anesthesia Post Note  Patient: Renee Price  Procedure(s) Performed: COLONOSCOPY (Rectum)     Anesthesia Post Evaluation No notable events documented.  Tobie Poet

## 2021-09-14 NOTE — Op Note (Signed)
[facility] Gastroenterology Patient Name: Renee Price Procedure Date: 09/14/2021 7:10 AM MRN: [id number] Account #: [id number] Date of Birth: 03/29/1970 Admit Type: Outpatient Age: 51 Room: [facility] OR ROOM 01 Gender: Female Note Status: Finalized Instrument Name: [id number] Procedure:             Colonoscopy Indications:           Screening for colorectal malignant neoplasm Providers:             Lucilla Lame MD, MD Referring MD:          Boykin Nearing, MD (Referring MD) Medicines:             Propofol per Anesthesia Complications:         No immediate complications. Procedure:             Pre-Anesthesia Assessment:                        - Prior to the procedure, a History and Physical was                         performed, and patient medications and allergies were                         reviewed. The patient's tolerance of previous                         anesthesia was also reviewed. The risks and benefits                         of the procedure and the sedation options and risks                         were discussed with the patient. All questions were                         answered, and informed consent was obtained. Prior                         Anticoagulants: The patient has taken no previous                         anticoagulant or antiplatelet agents. ASA Grade                         Assessment: II - A patient with mild systemic disease.                         After reviewing the risks and benefits, the patient                         was deemed in satisfactory condition to undergo the                         procedure.                        After obtaining informed consent, the colonoscope was  passed under direct vision. Throughout the procedure,                         the patient's blood pressure, pulse, and oxygen                         saturations were monitored continuously. The                          Colonoscope was introduced through the anus and                         advanced to the the cecum, identified by appendiceal                         orifice and ileocecal valve. The colonoscopy was                         performed without difficulty. The patient tolerated                         the procedure well. The quality of the bowel                         preparation was excellent. Findings:      The perianal and digital rectal examinations were normal.      Multiple small-mouthed diverticula were found in the sigmoid colon.      Non-bleeding internal hemorrhoids were found during retroflexion. The       hemorrhoids were Grade I (internal hemorrhoids that do not prolapse). Impression:            - Diverticulosis in the sigmoid colon.                        - Non-bleeding internal hemorrhoids.                        - No specimens collected. Recommendation:        - Discharge patient to home.                        - Resume previous diet.                        - Continue present medications.                        - Repeat colonoscopy in 10 years for screening                         purposes. Procedure Code(s):     --- Professional ---                        [phone], Colonoscopy, flexible; diagnostic, including                         collection of specimen(s) by brushing or washing, when                         performed (separate procedure) Diagnosis  Code(s):     --- Professional ---                        Z12.11, Encounter for screening for malignant neoplasm                         of colon CPT copyright 2019 [employer] Association. All rights reserved. The codes documented in this report are preliminary and upon coder review may  be revised to meet current compliance requirements. Lucilla Lame MD, MD 09/14/2021 7:57:32 AM This report has been signed electronically. Number of Addenda: 0 Note Initiated On: 09/14/2021 7:10 AM Scope Withdrawal Time: 0 hours 8 minutes 43  seconds  Total Procedure Duration: 0 hours 15 minutes 8 seconds  Estimated Blood Loss:  Estimated blood loss: none.      [facility]

## 2021-09-14 NOTE — Transfer of Care (Signed)
Immediate Anesthesia Transfer of Care Note  Patient: Renee Price  Procedure(s) Performed: COLONOSCOPY (Rectum)  Patient Location: PACU  Anesthesia Type: General  Level of Consciousness: awake, alert  and patient cooperative  Airway and Oxygen Therapy: Patient Spontanous Breathing and Patient connected to supplemental oxygen  Post-op Assessment: Post-op Vital signs reviewed, Patient's Cardiovascular Status Stable, Respiratory Function Stable, Patent Airway and No signs of Nausea or vomiting  Post-op Vital Signs: Reviewed and stable  Complications: No notable events documented.

## 2021-09-14 NOTE — H&P (Signed)
Renee Lame, MD [address] [place], [place] [address] Phone: [phone] Fax : [phone]  Primary Care Physician:  Schermerhorn, Gwen Her, MD Primary Gastroenterologist:  Dr. Allen Norris  Pre-Procedure History & Physical: HPI:  Renee Price is a 51 y.o. female is here for a screening colonoscopy.   Past Medical History:  Diagnosis Date   Chronic kidney disease    KIDNEY STONE   GERD (gastroesophageal reflux disease)    Kidney stone     Past Surgical History:  Procedure Laterality Date   AUGMENTATION MAMMAPLASTY Bilateral 2009   BREAST ENHANCEMENT SURGERY     COLONOSCOPY  2008   EXTRACORPOREAL SHOCK WAVE LITHOTRIPSY Left 07/30/2018   Procedure: EXTRACORPOREAL SHOCK WAVE LITHOTRIPSY (ESWL);  Surgeon: Royston Cowper, MD;  Location: [facility];  Service: Urology;  Laterality: Left;   GANGLION CYST EXCISION Left 12/07/2014   Procedure: REMOVAL GANGLION CYST FOOT;  Surgeon: Earnestine Leys, MD;  Location: [facility];  Service: Orthopedics;  Laterality: Left;   NASAL SEPTOPLASTY W/ TURBINOPLASTY Bilateral 02/15/2021   Procedure: NASAL SEPTOPLASTY WITH INFERIOR TURBINATE REDUCTION;  Surgeon: Margaretha Sheffield, MD;  Location: [place];  Service: ENT;  Laterality: Bilateral;   TUBAL LIGATION      Prior to Admission medications   Medication Sig Start Date End Date Taking? Authorizing Provider  amLODipine (NORVASC) 10 MG tablet Take 10 mg by mouth daily. 08/20/21  Yes [provider]  Docusate Calcium (STOOL SOFTENER PO) Take by mouth daily.   Yes [provider]  linaclotide Rolan Lipa) 290 MCG CAPS capsule Take 1 capsule (290 mcg total) by mouth daily before breakfast. 08/29/21  Yes Renee Lame, MD  LINZESS 145 MCG CAPS capsule Take 145 mcg by mouth every morning. 06/27/21  Yes [provider]  lisinopril (ZESTRIL) 30 MG tablet Take 30 mg by mouth daily.   Yes [provider]  meloxicam (MOBIC) 7.5 MG tablet Take 7.5 mg by mouth daily.    Yes [provider]  methocarbamol (ROBAXIN) 750 MG tablet Take 750 mg by mouth as needed. 04/12/16  Yes [provider]  Omega-3 Fatty Acids (FISH OIL PO) Take by mouth daily.   Yes [provider]  omeprazole (PRILOSEC) 10 MG capsule Take 10 mg by mouth daily.   Yes [provider]    Allergies as of 08/29/2021   (No Known Allergies)    Family History  Problem Relation Age of Onset   CAD Father    Hypertension Father    Hypertension Mother    Breast cancer Maternal Grandmother 32   Breast cancer Paternal Grandmother 26    Social History   Socioeconomic History   Marital status: Married    Spouse name: Not on file   Number of children: Not on file   Years of education: Not on file   Highest education level: Not on file  Occupational History   Not on file  Tobacco Use   Smoking status: Never   Smokeless tobacco: Never  Vaping Use   Vaping Use: Never used  Substance and Sexual Activity   Alcohol use: No    Comment: rare   Drug use: No   Sexual activity: Not on file  Other Topics Concern   Not on file  Social History Narrative   Not on file   Social Determinants of Health   Financial Resource Strain: Not on file  Food Insecurity: Not on file  Transportation Needs: Not on file  Physical Activity: Not  on file  Stress: Not on file  Social Connections: Not on file  Intimate Partner Violence: Not on file    Review of Systems: See HPI, otherwise negative ROS  Physical Exam: BP 122/71   Pulse 74   Temp 97.9 F (36.6 C) (Temporal)   Resp 16   Ht '5\' 1"'$  (1.549 m)   Wt 54.9 kg   LMP 08/24/2021 (Exact Date)   SpO2 99%   BMI 22.86 kg/m  General:   Alert,  pleasant and cooperative in NAD Head:  Normocephalic and atraumatic. Neck:  Supple; no masses or thyromegaly. Lungs:  Clear throughout to auscultation.    Heart:  Regular rate and rhythm. Abdomen:  Soft, nontender and nondistended. Normal bowel sounds, without guarding,  and without rebound.   Neurologic:  Alert and  oriented x4;  grossly normal neurologically.  Impression/Plan: Renee Price is now here to undergo a screening colonoscopy.  Risks, benefits, and alternatives regarding colonoscopy have been reviewed with the patient.  Questions have been answered.  All parties agreeable.

## 2021-09-18 ENCOUNTER — Encounter: Payer: Self-pay | Admitting: Gastroenterology

## 2021-09-19 DIAGNOSIS — F5222 Female sexual arousal disorder: Secondary | ICD-10-CM | POA: Diagnosis not present

## 2021-09-26 DIAGNOSIS — F5222 Female sexual arousal disorder: Secondary | ICD-10-CM | POA: Diagnosis not present

## 2021-11-22 DIAGNOSIS — M7541 Impingement syndrome of right shoulder: Secondary | ICD-10-CM | POA: Diagnosis not present

## 2021-12-05 DIAGNOSIS — M7541 Impingement syndrome of right shoulder: Secondary | ICD-10-CM | POA: Diagnosis not present

## 2021-12-13 DIAGNOSIS — M7541 Impingement syndrome of right shoulder: Secondary | ICD-10-CM | POA: Diagnosis not present

## 2021-12-24 IMAGING — MG DIGITAL SCREENING BREAST BILAT IMPLANT W/ TOMO W/ CAD
9 of 12 series · 9 of 28 positions shown · non-contrast
Comparison: Previous exam(s).

CLINICAL DATA: Screening.

EXAM:
DIGITAL SCREENING BILATERAL MAMMOGRAM WITH IMPLANTS, CAD AND
TOMOSYNTHESIS
TECHNIQUE: Bilateral screening digital craniocaudal and mediolateral oblique
mammograms were obtained. Bilateral screening digital breast
tomosynthesis was performed. The images were evaluated with
computer-aided detection. Standard and/or implant displaced views
were performed.

[R MLO]
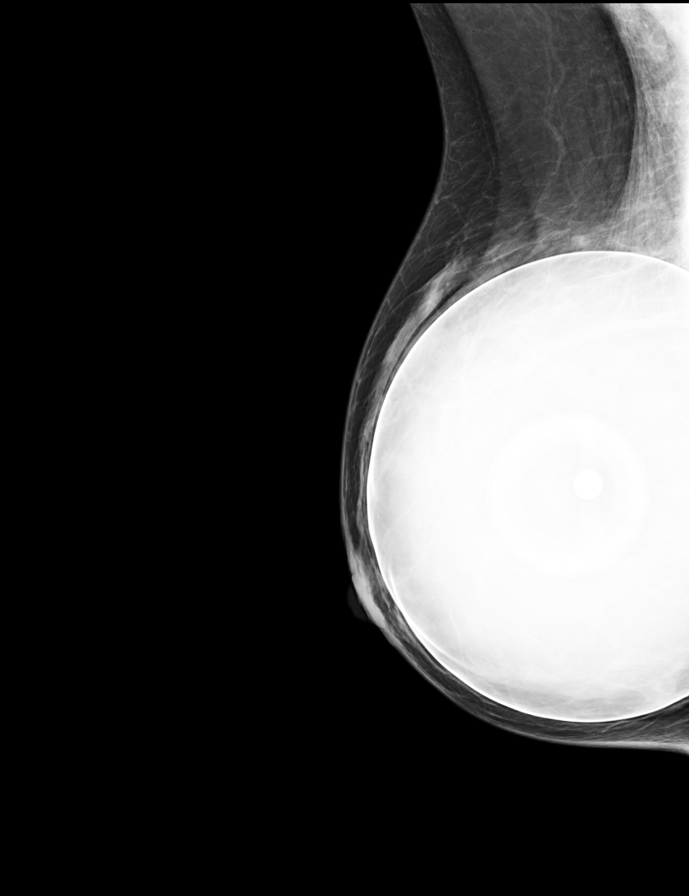

[L CC]
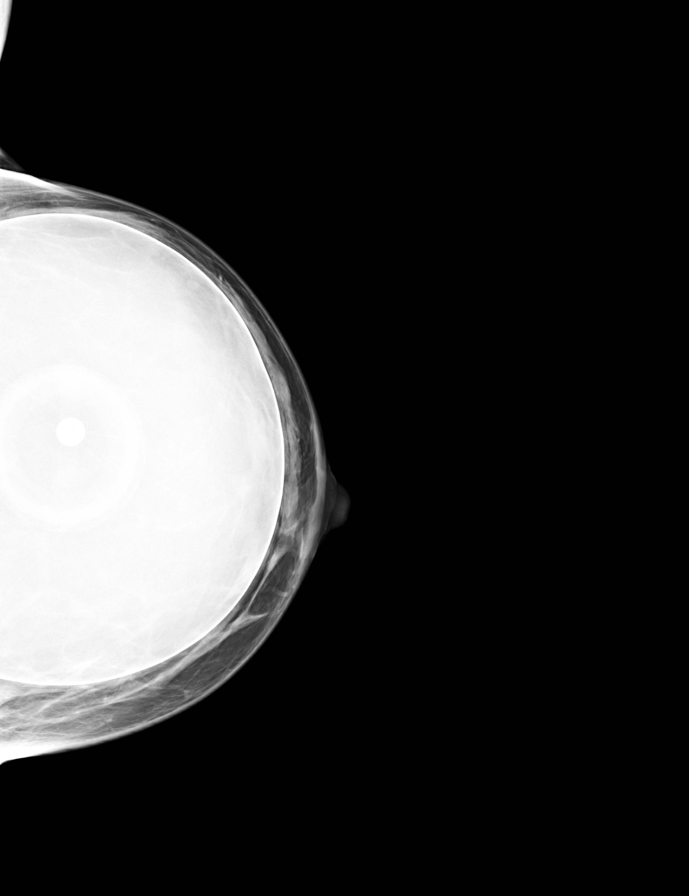

[L MLO]
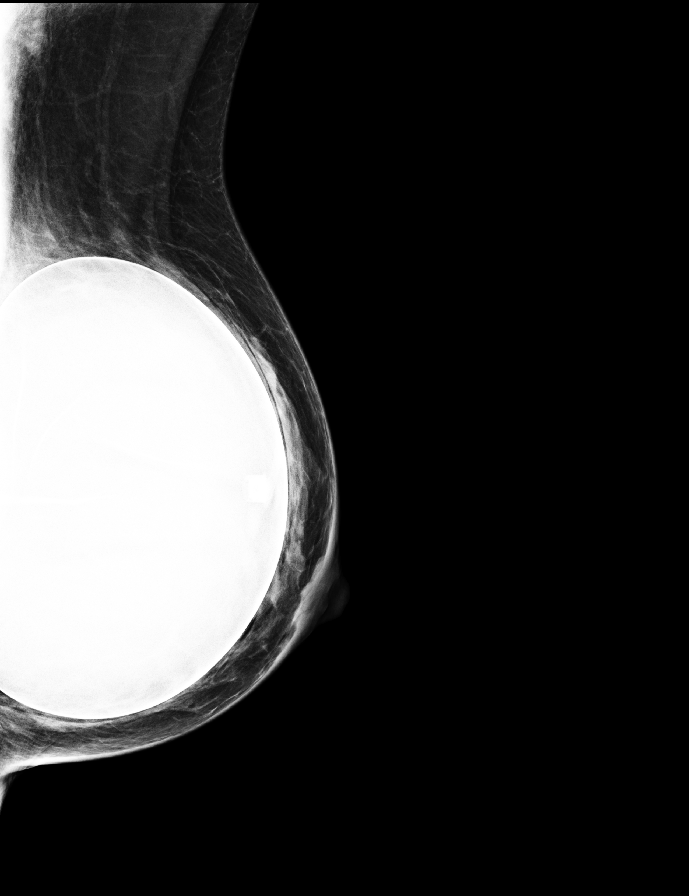

[R CC]
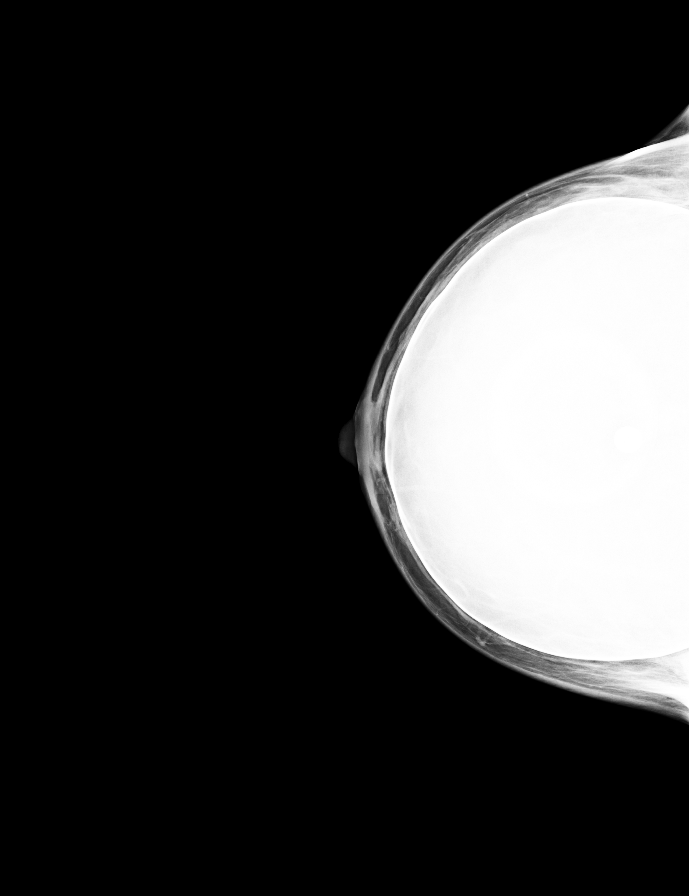

[L CC synth-2D]
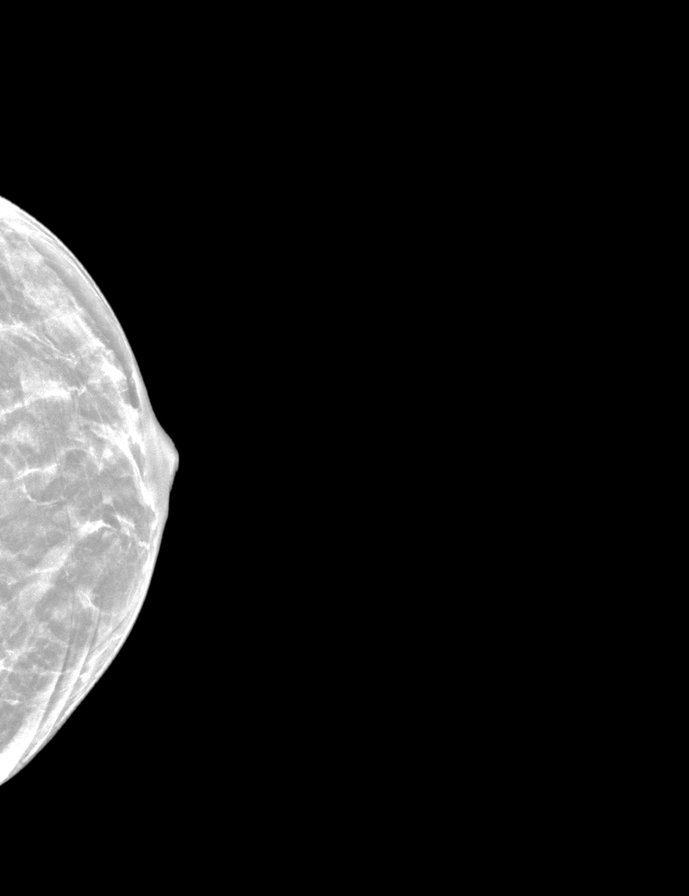

[R MLO synth-2D]
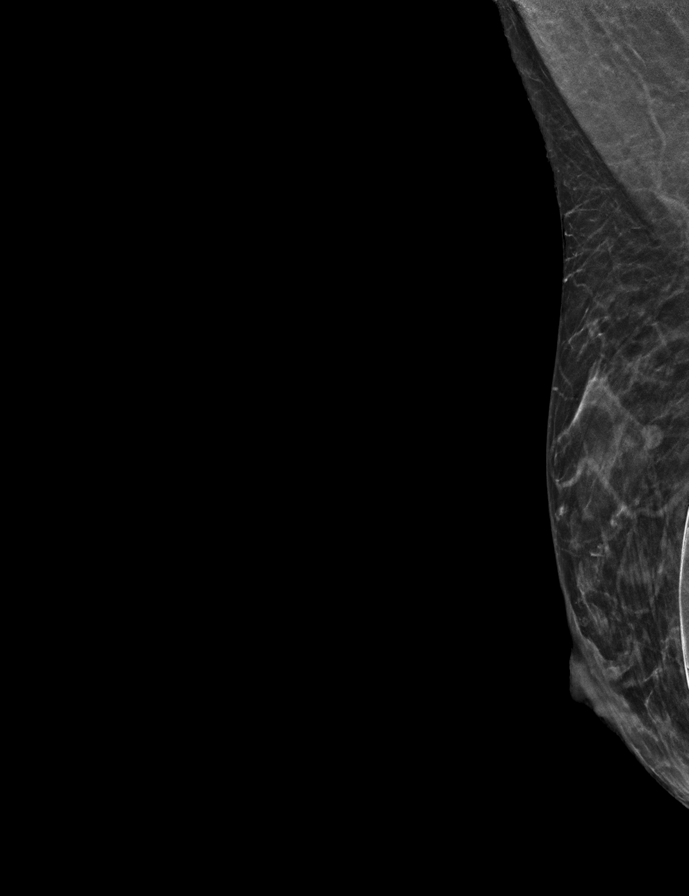

[L MLO synth-2D]
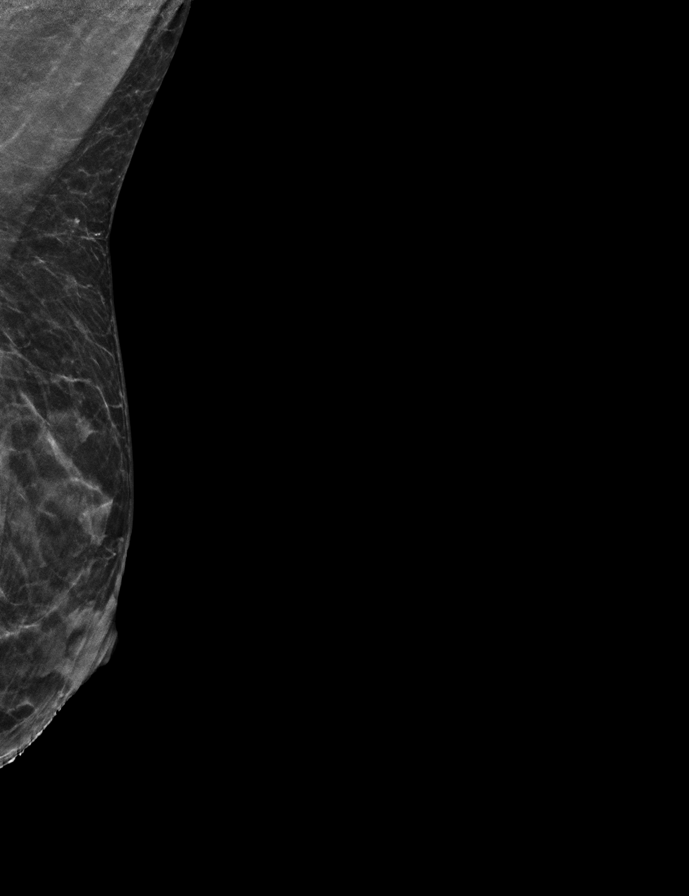

[R CC synth-2D]
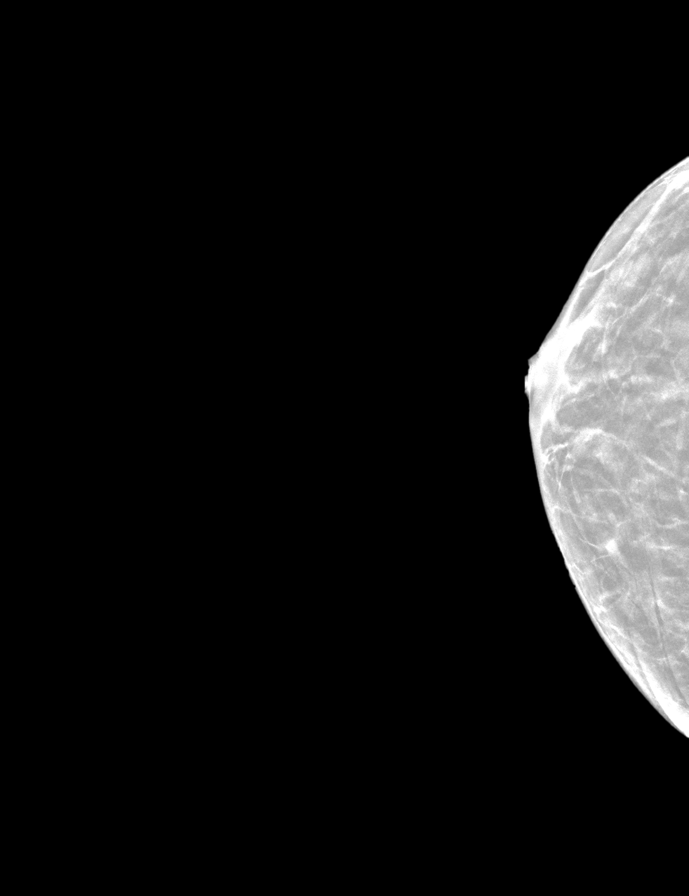

[R MLOID BREAST TOMOSYNTHESIS IMAGE tomo · tomo slice 23/44.0]
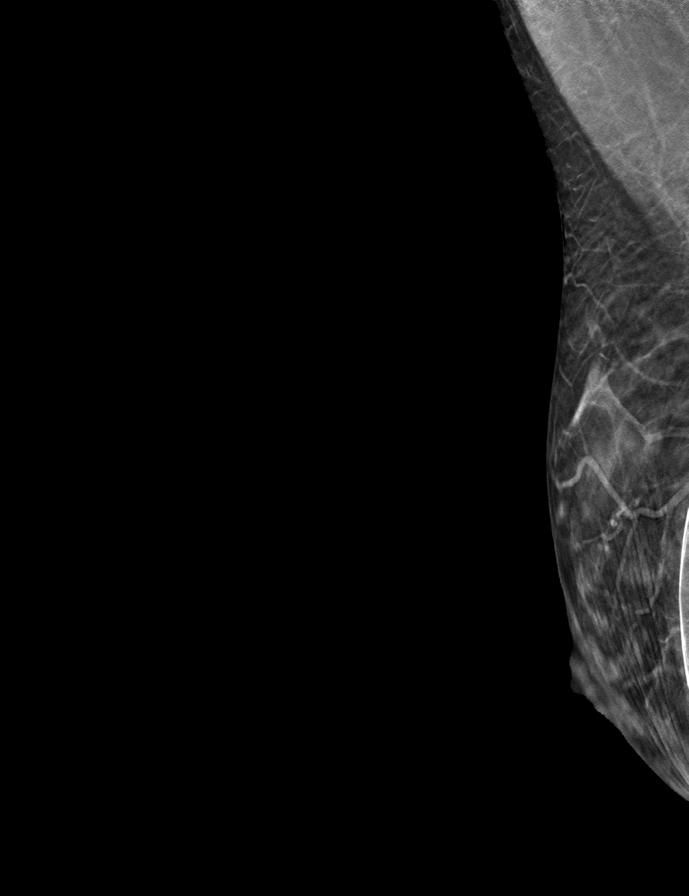

[9 of 28 positions shown; findings below may reference images not displayed]

ACR Breast Density Category b: There are scattered areas of
fibroglandular density.
FINDINGS: The patient has retropectoral saline implants. There are no findings
suspicious for malignancy.
IMPRESSION: No mammographic evidence of malignancy. A result letter of this
screening mammogram will be mailed directly to the patient.

RECOMMENDATION:
Screening mammogram in one year. (Code:1I-G-Z7L)

BI-RADS CATEGORY  1: Negative.

## 2022-01-30 DIAGNOSIS — M25511 Pain in right shoulder: Secondary | ICD-10-CM | POA: Diagnosis not present

## 2022-02-06 DIAGNOSIS — M25511 Pain in right shoulder: Secondary | ICD-10-CM | POA: Diagnosis not present

## 2022-02-08 DIAGNOSIS — M25511 Pain in right shoulder: Secondary | ICD-10-CM | POA: Diagnosis not present

## 2022-02-14 DIAGNOSIS — M25511 Pain in right shoulder: Secondary | ICD-10-CM | POA: Diagnosis not present

## 2022-02-18 DIAGNOSIS — M25511 Pain in right shoulder: Secondary | ICD-10-CM | POA: Diagnosis not present

## 2022-02-21 DIAGNOSIS — M25511 Pain in right shoulder: Secondary | ICD-10-CM | POA: Diagnosis not present

## 2022-04-05 DIAGNOSIS — M7541 Impingement syndrome of right shoulder: Secondary | ICD-10-CM | POA: Diagnosis not present

## 2022-04-21 DIAGNOSIS — J069 Acute upper respiratory infection, unspecified: Secondary | ICD-10-CM | POA: Diagnosis not present

## 2022-05-01 DIAGNOSIS — J019 Acute sinusitis, unspecified: Secondary | ICD-10-CM | POA: Diagnosis not present

## 2022-05-01 DIAGNOSIS — B9689 Other specified bacterial agents as the cause of diseases classified elsewhere: Secondary | ICD-10-CM | POA: Diagnosis not present

## 2022-05-01 DIAGNOSIS — H6991 Unspecified Eustachian tube disorder, right ear: Secondary | ICD-10-CM | POA: Diagnosis not present

## 2022-06-07 DIAGNOSIS — Z6824 Body mass index (BMI) 24.0-24.9, adult: Secondary | ICD-10-CM | POA: Diagnosis not present

## 2022-06-07 DIAGNOSIS — R300 Dysuria: Secondary | ICD-10-CM | POA: Diagnosis not present

## 2022-07-31 ENCOUNTER — Other Ambulatory Visit: Payer: Self-pay | Admitting: Obstetrics and Gynecology

## 2022-07-31 DIAGNOSIS — Z1331 Encounter for screening for depression: Secondary | ICD-10-CM | POA: Diagnosis not present

## 2022-07-31 DIAGNOSIS — Z01419 Encounter for gynecological examination (general) (routine) without abnormal findings: Secondary | ICD-10-CM | POA: Diagnosis not present

## 2022-07-31 DIAGNOSIS — Z1231 Encounter for screening mammogram for malignant neoplasm of breast: Secondary | ICD-10-CM

## 2022-07-31 DIAGNOSIS — Z124 Encounter for screening for malignant neoplasm of cervix: Secondary | ICD-10-CM | POA: Diagnosis not present

## 2022-08-27 ENCOUNTER — Ambulatory Visit
Admission: RE | Admit: 2022-08-27 | Discharge: 2022-08-27 | Disposition: A | Discharge status: Home / Self Care | Payer: BC Managed Care – PPO | Source: Ambulatory Visit | Attending: Obstetrics and Gynecology | Admitting: Obstetrics and Gynecology

## 2022-08-27 DIAGNOSIS — Z1231 Encounter for screening mammogram for malignant neoplasm of breast: Secondary | ICD-10-CM | POA: Diagnosis not present

## 2022-11-05 ENCOUNTER — Ambulatory Visit: Payer: BC Managed Care – PPO | Admitting: Nurse Practitioner

## 2022-12-16 ENCOUNTER — Encounter: Payer: Self-pay | Admitting: Nurse Practitioner

## 2022-12-16 ENCOUNTER — Ambulatory Visit: Payer: BC Managed Care – PPO | Admitting: Nurse Practitioner

## 2022-12-16 VITALS — BP 120/68 | HR 67 | Temp 98.1°F | Ht 61.0 in | Wt 134.0 lb

## 2022-12-16 DIAGNOSIS — K5904 Chronic idiopathic constipation: Secondary | ICD-10-CM | POA: Diagnosis not present

## 2022-12-16 DIAGNOSIS — I10 Essential (primary) hypertension: Secondary | ICD-10-CM

## 2022-12-16 DIAGNOSIS — Z23 Encounter for immunization: Secondary | ICD-10-CM

## 2022-12-16 DIAGNOSIS — K219 Gastro-esophageal reflux disease without esophagitis: Secondary | ICD-10-CM

## 2022-12-16 DIAGNOSIS — Z13220 Encounter for screening for lipoid disorders: Secondary | ICD-10-CM

## 2022-12-16 DIAGNOSIS — Z7689 Persons encountering health services in other specified circumstances: Secondary | ICD-10-CM

## 2022-12-16 DIAGNOSIS — E663 Overweight: Secondary | ICD-10-CM | POA: Insufficient documentation

## 2022-12-16 MED ORDER — LINZESS 145 MCG PO CAPS: 145.0000 ug | ORAL_CAPSULE | Freq: Every morning | ORAL | 3 refills | Status: AC

## 2022-12-16 MED ORDER — LISINOPRIL 30 MG PO TABS: 30.0000 mg | ORAL_TABLET | Freq: Every day | ORAL | 3 refills | Status: DC

## 2022-12-16 MED ORDER — AMLODIPINE BESYLATE 10 MG PO TABS: 10.0000 mg | ORAL_TABLET | Freq: Every day | ORAL | 3 refills | Status: DC

## 2022-12-16 NOTE — Assessment & Plan Note (Signed)
Patient use to be a part of [facility].  Unable to review electronic medical records

## 2022-12-16 NOTE — Patient Instructions (Signed)
Nice to see you today Make a fasting lab appointment over the next 2 weeks Follow up with me in 1 year for your next physical, sooner if you need me We did update your tetanus vaccine today

## 2022-12-16 NOTE — Assessment & Plan Note (Signed)
Patient currently maintained on omeprazole 10 mg daily.  Continue

## 2022-12-16 NOTE — Assessment & Plan Note (Signed)
Patient currently maintained on Linzess 140 mcg daily as needed.  Refill provided today

## 2022-12-16 NOTE — Assessment & Plan Note (Signed)
Patient currently maintained on amlodipine 10 mg and lisinopril 30 mg daily.  Patient's blood pressure well-controlled.  Patient tolerates medication well.  Continue medication as prescribed refills provided today

## 2022-12-16 NOTE — Progress Notes (Signed)
New Patient Office Visit  Subjective    Patient ID: Renee Price, female    DOB: 1970-03-26  Age: 52 y.o. MRN: [id number]  CC:  Chief Complaint  Patient presents with   Establish Care    HPI PRAGNYA CORVI presents to establish care   HTN: tolerates medications well. States that she will check couple times a week and is on amlopidine and lisinopril.  Constipation: linzess as needed. She will have BMs approx every other day   GERD: on omeprazole 10mg  and she does avoid trigger foods   Colonoscopy: 09/14/2021  Mammogram: 08/27/2022, repeat in 10 years. Due 2034 Pap smear: this year at 07/31/2022 no abmroal   Dexa: Too young is followed by GYN  Tdap: 2014. Update today  Flu: refused Covid: original series Shingles: up to date  Pna: too young   Was seeing Dr Dian Situ at [facility]  Outpatient Encounter Medications as of 12/16/2022  Medication Sig   Docusate Calcium (STOOL SOFTENER PO) Take by mouth daily.   meloxicam (MOBIC) 7.5 MG tablet Take 7.5 mg by mouth daily.   Omega-3 Fatty Acids (FISH OIL PO) Take by mouth daily.   omeprazole (PRILOSEC) 10 MG capsule Take 10 mg by mouth daily.   [DISCONTINUED] amLODipine (NORVASC) 10 MG tablet Take 10 mg by mouth daily.   [DISCONTINUED] LINZESS 145 MCG CAPS capsule Take 145 mcg by mouth every morning.   [DISCONTINUED] lisinopril (ZESTRIL) 30 MG tablet Take 30 mg by mouth daily.   amLODipine (NORVASC) 10 MG tablet Take 1 tablet (10 mg total) by mouth daily.   LINZESS 145 MCG CAPS capsule Take 1 capsule (145 mcg total) by mouth every morning.   lisinopril (ZESTRIL) 30 MG tablet Take 1 tablet (30 mg total) by mouth daily.   [DISCONTINUED] linaclotide (LINZESS) 290 MCG CAPS capsule Take 1 capsule (290 mcg total) by mouth daily before breakfast. (Patient not taking: Reported on 12/16/2022)   [DISCONTINUED] methocarbamol (ROBAXIN) 750 MG tablet Take 750 mg by mouth as needed. (Patient not taking: Reported on  12/16/2022)   No facility-administered encounter medications on file as of 12/16/2022.    Past Medical History:  Diagnosis Date   Chronic kidney disease    KIDNEY STONE   GERD (gastroesophageal reflux disease)    Hypertension    Kidney stone     Past Surgical History:  Procedure Laterality Date   AUGMENTATION MAMMAPLASTY Bilateral 2009   BREAST ENHANCEMENT SURGERY     COLONOSCOPY  2008   COLONOSCOPY N/A 09/14/2021   Procedure: COLONOSCOPY;  Surgeon: Midge Minium, MD;  Location: [facility];  Service: Endoscopy;  Laterality: N/A;   EXTRACORPOREAL SHOCK WAVE LITHOTRIPSY Left 07/30/2018   Procedure: EXTRACORPOREAL SHOCK WAVE LITHOTRIPSY (ESWL);  Surgeon: Orson Ape, MD;  Location: [facility];  Service: Urology;  Laterality: Left;   GANGLION CYST EXCISION Left 12/07/2014   Procedure: REMOVAL GANGLION CYST FOOT;  Surgeon: Deeann Saint, MD;  Location: [facility];  Service: Orthopedics;  Laterality: Left;   NASAL SEPTOPLASTY W/ TURBINOPLASTY Bilateral 02/15/2021   Procedure: NASAL SEPTOPLASTY WITH INFERIOR TURBINATE REDUCTION;  Surgeon: Vernie Murders, MD;  Location: [facility];  Service: ENT;  Laterality: Bilateral;   TUBAL LIGATION      Family History  Problem Relation Age of Onset   CAD Father    Hypertension Father    Hypertension Mother    Breast cancer Maternal Grandmother 12   Breast cancer Paternal Grandmother 69    Social History  Socioeconomic History   Marital status: Married    Spouse name: ramie   Number of children: 1   Years of education: Not on file   Highest education level: Not on file  Occupational History   Not on file  Tobacco Use   Smoking status: Never   Smokeless tobacco: Never  Vaping Use   Vaping status: Never Used  Substance and Sexual Activity   Alcohol use: No    Comment: rare   Drug use: No   Sexual activity: Not on file  Other Topics Concern   Not on file  Social History Narrative   Fulltime: [employer]      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  Respiratory:  Negative for shortness of breath.   Cardiovascular:  Negative for chest pain and leg swelling.  Gastrointestinal:  Positive for constipation. Negative for abdominal pain, blood in stool, diarrhea, nausea and vomiting.  Genitourinary:  Negative for dysuria and hematuria.  Neurological:  Negative for dizziness, tingling and headaches.  Psychiatric/Behavioral:  Negative for hallucinations and suicidal ideas.         Objective    BP 120/68   Pulse 67   Temp 98.1 F (36.7 C) (Oral)   Ht 5\' 1"  (1.549 m)   Wt 134 lb (60.8 kg)   LMP 11/25/2022   SpO2 98%   BMI 25.32 kg/m   Physical Exam Vitals and nursing note reviewed.  Constitutional:      Appearance: Normal appearance.  HENT:     Right Ear: Tympanic membrane, ear canal and external ear normal.     Left Ear: Tympanic membrane, ear canal and external ear normal.     Mouth/Throat:     Mouth: Mucous membranes are moist.     Pharynx: Oropharynx is clear.  Eyes:     Extraocular Movements: Extraocular movements intact.     Pupils: Pupils are equal, round, and reactive to light.  Cardiovascular:     Rate and Rhythm: Normal rate and regular rhythm.     Pulses: Normal pulses.     Heart sounds: Normal heart sounds.  Pulmonary:     Effort: Pulmonary effort is normal.     Breath sounds: Normal breath sounds.  Abdominal:     General: Bowel sounds are normal. There is no distension.     Palpations: There is no mass.     Tenderness: There is no abdominal tenderness.     Hernia: No hernia is present.  Musculoskeletal:     Right lower leg: No edema.     Left lower leg: No edema.   Lymphadenopathy:     Cervical: No cervical adenopathy.  Skin:    General: Skin is warm.  Neurological:     General: No focal deficit present.     Mental Status: She is alert.     Deep Tendon Reflexes:     Reflex Scores:      Bicep reflexes are 2+ on the right side and 2+ on the left side.      Patellar reflexes are 2+ on the right side and 2+ on the left side.    Comments: Bilateral  upper and lower extremity strength 5/5  Psychiatric:        Mood and Affect: Mood normal.        Behavior: Behavior normal.        Thought Content: Thought content normal.        Judgment: Judgment normal.         Assessment & Plan:   Problem List Items Addressed This Visit       Cardiovascular and Mediastinum   Primary hypertension - Primary    Patient currently maintained on amlodipine 10 mg and lisinopril 30 mg daily.  Patient's blood pressure well-controlled.  Patient tolerates medication well.  Continue medication as prescribed refills provided today      Relevant Medications   amLODipine (NORVASC) 10 MG tablet   lisinopril (ZESTRIL) 30 MG tablet   Other Relevant Orders   CBC   Comprehensive metabolic panel   TSH     Digestive   Acid reflux    Patient currently maintained on omeprazole 10 mg daily.  Continue      Relevant Medications   LINZESS 145 MCG CAPS capsule     Other   Constipation    Patient currently maintained on Linzess 140 mcg daily as needed.  Refill provided today      Relevant Medications   LINZESS 145 MCG CAPS capsule   Other Relevant Orders   TSH   Encounter to establish care with new doctor    Patient use to be a part of [facility].  Unable to review electronic medical records      Overweight    Pending TSH, A1c, lipid panel.      Relevant Orders   Hemoglobin A1c   Lipid panel   Other Visit Diagnoses     Need for Tdap vaccination       Screening for lipid disorders       Relevant Orders   Lipid panel   Encounter for  immunization       Relevant Orders   Tdap vaccine greater than or equal to 7yo IM (Completed)       Return in about 1 year (around 12/16/2023) for CPE and Labs.   Audria Nine, NP

## 2022-12-16 NOTE — Assessment & Plan Note (Signed)
Pending TSH, A1c, lipid panel.

## 2022-12-23 ENCOUNTER — Other Ambulatory Visit (INDEPENDENT_AMBULATORY_CARE_PROVIDER_SITE_OTHER): Payer: BC Managed Care – PPO

## 2022-12-23 DIAGNOSIS — K5904 Chronic idiopathic constipation: Secondary | ICD-10-CM

## 2022-12-23 DIAGNOSIS — I10 Essential (primary) hypertension: Secondary | ICD-10-CM

## 2022-12-23 DIAGNOSIS — Z13220 Encounter for screening for lipoid disorders: Secondary | ICD-10-CM

## 2022-12-23 DIAGNOSIS — E663 Overweight: Secondary | ICD-10-CM | POA: Diagnosis not present

## 2022-12-23 NOTE — Addendum Note (Signed)
Addended by: Alvina Chou on: 12/23/2022 08:06 AM   Modules accepted: Orders

## 2022-12-24 LAB — CBC WITH DIFFERENTIAL/PLATELET
Basophils Absolute: 0.1 10*3/uL (ref 0.0–0.2)
Basos: 1 %
EOS (ABSOLUTE): 0.2 10*3/uL (ref 0.0–0.4)
Eos: 3 %
Hematocrit: 43.4 % (ref 34.0–46.6)
Hemoglobin: 14.5 g/dL (ref 11.1–15.9)
Immature Grans (Abs): 0 10*3/uL (ref 0.0–0.1)
Immature Granulocytes: 0 %
Lymphocytes Absolute: 2.4 10*3/uL (ref 0.7–3.1)
Lymphs: 35 %
MCH: 30.8 pg (ref 26.6–33.0)
MCHC: 33.4 g/dL (ref 31.5–35.7)
MCV: 92 fL (ref 79–97)
Monocytes Absolute: 0.6 10*3/uL (ref 0.1–0.9)
Monocytes: 8 %
Neutrophils Absolute: 3.5 10*3/uL (ref 1.4–7.0)
Neutrophils: 53 %
Platelets: 209 10*3/uL (ref 150–450)
RBC: 4.71 x10E6/uL (ref 3.77–5.28)
RDW: 12.7 % (ref 11.7–15.4)
WBC: 6.7 10*3/uL (ref 3.4–10.8)

## 2022-12-24 LAB — HEMOGLOBIN A1C
Est. average glucose Bld gHb Est-mCnc: 111 mg/dL
Hgb A1c MFr Bld: 5.5 % (ref 4.8–5.6)

## 2022-12-24 LAB — COMPREHENSIVE METABOLIC PANEL
ALT: 13 [IU]/L (ref 0–32)
AST: 15 [IU]/L (ref 0–40)
Albumin: 4.3 g/dL (ref 3.8–4.9)
Alkaline Phosphatase: 50 [IU]/L (ref 44–121)
BUN/Creatinine Ratio: 16 (ref 9–23)
BUN: 13 mg/dL (ref 6–24)
Bilirubin Total: 0.5 mg/dL (ref 0.0–1.2)
CO2: 19 mmol/L — ABNORMAL LOW (ref 20–29)
Calcium: 9.3 mg/dL (ref 8.7–10.2)
Chloride: 103 mmol/L (ref 96–106)
Creatinine, Ser: 0.79 mg/dL (ref 0.57–1.00)
Globulin, Total: 2.5 g/dL (ref 1.5–4.5)
Glucose: 102 mg/dL — ABNORMAL HIGH (ref 70–99)
Potassium: 4.2 mmol/L (ref 3.5–5.2)
Sodium: 138 mmol/L (ref 134–144)
Total Protein: 6.8 g/dL (ref 6.0–8.5)
eGFR: 91 mL/min/{1.73_m2} (ref >59)

## 2022-12-24 LAB — LIPID PANEL
Chol/HDL Ratio: 2.5 {ratio} (ref 0.0–4.4)
Cholesterol, Total: 194 mg/dL (ref 100–199)
HDL: 78 mg/dL (ref >39)
LDL Chol Calc (NIH): 103 mg/dL — ABNORMAL HIGH (ref 0–99)
Triglycerides: 70 mg/dL (ref 0–149)
VLDL Cholesterol Cal: 13 mg/dL (ref 5–40)

## 2022-12-24 LAB — TSH: TSH: 2.4 u[IU]/mL (ref 0.450–4.500)

## 2023-01-03 ENCOUNTER — Ambulatory Visit
Admission: RE | Admit: 2023-01-03 | Discharge: 2023-01-03 | Disposition: A | Discharge status: Home / Self Care | Payer: BC Managed Care – PPO | Source: Ambulatory Visit | Attending: Emergency Medicine | Admitting: Emergency Medicine

## 2023-01-03 DIAGNOSIS — H9201 Otalgia, right ear: Secondary | ICD-10-CM | POA: Diagnosis not present

## 2023-01-03 MED ORDER — CIPROFLOXACIN-DEXAMETHASONE 0.3-0.1 % OT SUSP: 4.0000 [drp] | Freq: Two times a day (BID) | OTIC | 0 refills | Status: AC

## 2023-01-03 NOTE — ED Provider Notes (Signed)
Renaldo Fiddler    CSN: [id number] Arrival date & time: 01/03/23  1147      History   Chief Complaint Chief Complaint  Patient presents with   Ear Fullness    Woke up with a little dizziness. Some pain behind my ear as well - Entered by patient    HPI Renee Price is a 52 y.o. female.   Patient presents for evaluation of pain surrounding the right ear and dizziness beginning 2 days ago.  Ear feels full but hearing not affected.  Dizziness exacerbated when tilting the head to the right side and with change of position.  Has not occurred before.  Has attempted use of a heating pad without improvement.  Denies fever, sore throat, congestion or cough.  Past Medical History:  Diagnosis Date   Chronic kidney disease    KIDNEY STONE   GERD (gastroesophageal reflux disease)    Hypertension    Kidney stone     Patient Active Problem List   Diagnosis Date Noted   Overweight 12/16/2022   Primary hypertension 12/16/2022   Encounter to establish care with new doctor    Acid reflux 10/17/2014   Constipation 10/17/2014   Cannot sleep 10/17/2014   Allergic rhinitis 10/17/2014   Renal cyst, left 10/16/2014    Past Surgical History:  Procedure Laterality Date   AUGMENTATION MAMMAPLASTY Bilateral 2009   BREAST ENHANCEMENT SURGERY     COLONOSCOPY  2008   COLONOSCOPY N/A 09/14/2021   Procedure: COLONOSCOPY;  Surgeon: Midge Minium, MD;  Location: [facility];  Service: Endoscopy;  Laterality: N/A;   EXTRACORPOREAL SHOCK WAVE LITHOTRIPSY Left 07/30/2018   Procedure: EXTRACORPOREAL SHOCK WAVE LITHOTRIPSY (ESWL);  Surgeon: Orson Ape, MD;  Location: [facility];  Service: Urology;  Laterality: Left;   GANGLION CYST EXCISION Left 12/07/2014   Procedure: REMOVAL GANGLION CYST FOOT;  Surgeon: Deeann Saint, MD;  Location: [facility];  Service: Orthopedics;  Laterality: Left;   NASAL SEPTOPLASTY W/ TURBINOPLASTY Bilateral 02/15/2021   Procedure: NASAL SEPTOPLASTY WITH INFERIOR  TURBINATE REDUCTION;  Surgeon: Vernie Murders, MD;  Location: [facility];  Service: ENT;  Laterality: Bilateral;   TUBAL LIGATION      OB History   No obstetric history on file.      Home Medications    Prior to Admission medications   Medication Sig Start Date End Date Taking? Authorizing Provider  ciprofloxacin-dexamethasone (CIPRODEX) OTIC suspension Place 4 drops into both ears 2 (two) times daily. 01/03/23  Yes Stacy Sailer R, NP  amLODipine (NORVASC) 10 MG tablet Take 1 tablet (10 mg total) by mouth daily. 12/16/22   Eden Emms, NP  Docusate Calcium (STOOL SOFTENER PO) Take by mouth daily.    [provider]  LINZESS 145 MCG CAPS capsule Take 1 capsule (145 mcg total) by mouth every morning. 12/16/22   Eden Emms, NP  lisinopril (ZESTRIL) 30 MG tablet Take 1 tablet (30 mg total) by mouth daily. 12/16/22   Eden Emms, NP  meloxicam (MOBIC) 7.5 MG tablet Take 7.5 mg by mouth daily.    [provider]  Omega-3 Fatty Acids (FISH OIL PO) Take by mouth daily.    [provider]  omeprazole (PRILOSEC) 10 MG capsule Take 10 mg by mouth daily.    [provider]    Family History Family History  Problem Relation Age of Onset   CAD Father    Hypertension Father    Hypertension Mother  Breast cancer Maternal Grandmother 80   Breast cancer Paternal Grandmother 59    Social History Social History   Tobacco Use   Smoking status: Never   Smokeless tobacco: Never  Vaping Use   Vaping status: Never Used  Substance Use Topics   Alcohol use: No    Comment: rare   Drug use: No     Allergies   Patient has no known allergies.   Review of Systems Review of Systems   Physical Exam Triage Vital Signs ED Triage Vitals  Encounter Vitals Group     BP      Systolic BP Percentile      Diastolic BP Percentile      Pulse      Resp      Temp      Temp src      SpO2      Weight      Height      Head Circumference       Peak Flow      Pain Score      Pain Loc      Pain Education      Exclude from Growth Chart    No data found.  Updated Vital Signs LMP 11/25/2022   Visual Acuity Right Eye Distance:   Left Eye Distance:   Bilateral Distance:    Right Eye Near:   Left Eye Near:    Bilateral Near:     Physical Exam Constitutional:      Appearance: Normal appearance.  HENT:     Right Ear: Tympanic membrane, ear canal and external ear normal.     Left Ear: Tympanic membrane, ear canal and external ear normal.     Nose: Nose normal.     Mouth/Throat:     Mouth: Mucous membranes are moist.     Pharynx: Oropharynx is clear.  Eyes:     Extraocular Movements: Extraocular movements intact.  Pulmonary:     Effort: Pulmonary effort is normal.  Neurological:     Mental Status: She is alert and oriented to person, place, and time.      UC Treatments / Results  Labs (all labs ordered are listed, but only abnormal results are displayed) Labs Reviewed - No data to display  EKG   Radiology No results found.  Procedures Procedures (including critical care time)  Medications Ordered in UC Medications - No data to display  Initial Impression / Assessment and Plan / UC Course  I have reviewed the triage vital signs and the nursing notes.  Pertinent labs & imaging results that were available during my care of the patient were reviewed by me and considered in my medical decision making (see chart for details).  Otalgia of the right ear  On exam no abnormality noted, discussed with patient, mild cervical adenopathy to the right side we will provide bacterial coverage, Ciprodex prescribed and discussed administration, recommended additional supportive care with follow-up if symptoms do not improve Final Clinical Impressions(s) / UC Diagnoses   Final diagnoses:  Otalgia, right ear     Discharge Instructions      Today you are evaluated for your pain, on exam there are no internal  abnormalities and no overt abnormalities on the external exam   Prophylactically initiate eardrops, Place 4 drops of Ciprodex into the ear every morning and every evening for 7 days to provide coverage for bacteria also is a mixture of a steroid to help reduce pain  You may  use Tylenol or ibuprofen every 6 hours as needed  May hold warm compresses to the external ear for additional support  If symptoms continue to persist or worsen please follow-up for reevaluation   ED Prescriptions     Medication Sig Dispense Auth. Provider   ciprofloxacin-dexamethasone (CIPRODEX) OTIC suspension Place 4 drops into both ears 2 (two) times daily. 7.5 mL Valinda Hoar, NP      PDMP not reviewed this encounter.   Valinda Hoar, NP 01/03/23 1257

## 2023-01-03 NOTE — Discharge Instructions (Signed)
Today you are evaluated for your pain, on exam there are no internal abnormalities and no overt abnormalities on the external exam   Prophylactically initiate eardrops, Place 4 drops of Ciprodex into the ear every morning and every evening for 7 days to provide coverage for bacteria also is a mixture of a steroid to help reduce pain  You may use Tylenol or ibuprofen every 6 hours as needed  May hold warm compresses to the external ear for additional support  If symptoms continue to persist or worsen please follow-up for reevaluation

## 2023-01-03 NOTE — ED Triage Notes (Signed)
Triaged by provider  

## 2023-04-21 DIAGNOSIS — H903 Sensorineural hearing loss, bilateral: Secondary | ICD-10-CM | POA: Diagnosis not present

## 2023-04-21 DIAGNOSIS — H8113 Benign paroxysmal vertigo, bilateral: Secondary | ICD-10-CM | POA: Diagnosis not present

## 2023-04-23 DIAGNOSIS — H8111 Benign paroxysmal vertigo, right ear: Secondary | ICD-10-CM | POA: Diagnosis not present

## 2023-08-06 ENCOUNTER — Other Ambulatory Visit: Payer: Self-pay | Admitting: Obstetrics and Gynecology

## 2023-08-06 DIAGNOSIS — Z1331 Encounter for screening for depression: Secondary | ICD-10-CM | POA: Diagnosis not present

## 2023-08-06 DIAGNOSIS — Z01411 Encounter for gynecological examination (general) (routine) with abnormal findings: Secondary | ICD-10-CM | POA: Diagnosis not present

## 2023-08-06 DIAGNOSIS — Z1231 Encounter for screening mammogram for malignant neoplasm of breast: Secondary | ICD-10-CM

## 2023-08-06 DIAGNOSIS — R232 Flushing: Secondary | ICD-10-CM | POA: Diagnosis not present

## 2023-08-06 DIAGNOSIS — R42 Dizziness and giddiness: Secondary | ICD-10-CM | POA: Diagnosis not present

## 2023-09-12 ENCOUNTER — Ambulatory Visit
Admission: RE | Admit: 2023-09-12 | Discharge: 2023-09-12 | Disposition: A | Discharge status: Home / Self Care | Source: Ambulatory Visit | Attending: Obstetrics and Gynecology | Admitting: Obstetrics and Gynecology

## 2023-09-12 DIAGNOSIS — Z1231 Encounter for screening mammogram for malignant neoplasm of breast: Secondary | ICD-10-CM | POA: Diagnosis not present

## 2023-09-18 ENCOUNTER — Other Ambulatory Visit: Payer: Self-pay | Admitting: Student

## 2023-09-18 DIAGNOSIS — R42 Dizziness and giddiness: Secondary | ICD-10-CM

## 2023-09-18 DIAGNOSIS — R519 Headache, unspecified: Secondary | ICD-10-CM

## 2023-09-20 ENCOUNTER — Ambulatory Visit
Admission: RE | Admit: 2023-09-20 | Discharge: 2023-09-20 | Disposition: A | Discharge status: Home / Self Care | Source: Ambulatory Visit | Attending: Student | Admitting: Student

## 2023-09-20 DIAGNOSIS — R519 Headache, unspecified: Secondary | ICD-10-CM | POA: Diagnosis present

## 2023-09-20 DIAGNOSIS — R42 Dizziness and giddiness: Secondary | ICD-10-CM | POA: Diagnosis present

## 2023-10-06 ENCOUNTER — Ambulatory Visit

## 2023-10-08 ENCOUNTER — Ambulatory Visit

## 2023-10-13 ENCOUNTER — Ambulatory Visit

## 2023-10-15 ENCOUNTER — Ambulatory Visit

## 2023-10-20 ENCOUNTER — Ambulatory Visit

## 2023-10-22 ENCOUNTER — Ambulatory Visit

## 2023-10-27 ENCOUNTER — Ambulatory Visit

## 2023-10-29 ENCOUNTER — Ambulatory Visit

## 2023-11-03 ENCOUNTER — Ambulatory Visit

## 2023-11-05 ENCOUNTER — Ambulatory Visit

## 2023-11-12 ENCOUNTER — Ambulatory Visit: Admitting: Physical Therapy

## 2023-11-14 ENCOUNTER — Ambulatory Visit: Admitting: Physical Therapy

## 2023-11-17 ENCOUNTER — Ambulatory Visit

## 2023-11-19 ENCOUNTER — Ambulatory Visit

## 2023-11-24 ENCOUNTER — Ambulatory Visit

## 2023-11-26 ENCOUNTER — Ambulatory Visit

## 2023-12-16 ENCOUNTER — Other Ambulatory Visit: Payer: Self-pay | Admitting: Nurse Practitioner

## 2023-12-16 DIAGNOSIS — I10 Essential (primary) hypertension: Secondary | ICD-10-CM

## 2023-12-17 NOTE — Telephone Encounter (Signed)
 Patient needs a CPE in 30 days to continue getting refills

## 2024-01-01 NOTE — Telephone Encounter (Signed)
Called and schedule pt

## 2024-01-03 ENCOUNTER — Other Ambulatory Visit: Payer: Self-pay | Admitting: Nurse Practitioner

## 2024-01-03 DIAGNOSIS — I10 Essential (primary) hypertension: Secondary | ICD-10-CM

## 2024-01-10 ENCOUNTER — Other Ambulatory Visit: Payer: Self-pay | Admitting: Nurse Practitioner

## 2024-01-10 DIAGNOSIS — I10 Essential (primary) hypertension: Secondary | ICD-10-CM

## 2024-02-04 ENCOUNTER — Other Ambulatory Visit: Payer: Self-pay | Admitting: Nurse Practitioner

## 2024-02-04 DIAGNOSIS — I10 Essential (primary) hypertension: Secondary | ICD-10-CM

## 2024-02-04 NOTE — Telephone Encounter (Signed)
 LAST APPOINTMENT DATE: 12/16/22   NEXT APPOINTMENT DATE: 02/26/2024  Amlodipine  sent to [facility] delie[facility]   LAST REFILL: 12/17/23  QTY: #30, 1RF

## 2024-02-20 ENCOUNTER — Telehealth: Payer: Self-pay

## 2024-02-20 NOTE — Telephone Encounter (Signed)
 Pt requests new refill of Amlodipine  to be sent to [facility] home delivery pharmacy.

## 2024-02-20 NOTE — Telephone Encounter (Signed)
 I sent in a refill on 02/04/2024 for 2 months, she is over due for a physical. She needs to be seen prior to getting more refills

## 2024-02-26 ENCOUNTER — Encounter: Admitting: Nurse Practitioner
